# Patient Record
Sex: Female | Born: 1955 | Hispanic: Yes | Marital: Married | Smoking: Never smoker
Health system: Southern US, Community
[De-identification: ages and names within clinical notes are randomized; demographics above are authoritative.]

## PROBLEM LIST (undated history)

## (undated) DIAGNOSIS — I10 Essential (primary) hypertension: Secondary | ICD-10-CM

## (undated) DIAGNOSIS — R600 Localized edema: Secondary | ICD-10-CM

## (undated) DIAGNOSIS — R55 Syncope and collapse: Secondary | ICD-10-CM

## (undated) DIAGNOSIS — I493 Ventricular premature depolarization: Secondary | ICD-10-CM

## (undated) DIAGNOSIS — E785 Hyperlipidemia, unspecified: Secondary | ICD-10-CM

## (undated) DIAGNOSIS — I350 Nonrheumatic aortic (valve) stenosis: Secondary | ICD-10-CM

## (undated) DIAGNOSIS — I2510 Atherosclerotic heart disease of native coronary artery without angina pectoris: Secondary | ICD-10-CM

## (undated) DIAGNOSIS — I491 Atrial premature depolarization: Secondary | ICD-10-CM

## (undated) HISTORY — DX: Syncope and collapse: R55

## (undated) HISTORY — DX: Nonrheumatic aortic (valve) stenosis: I35.0

## (undated) HISTORY — DX: Localized edema: R60.0

## (undated) HISTORY — PX: TUBAL LIGATION: SHX77

## (undated) HISTORY — DX: Essential (primary) hypertension: I10

## (undated) HISTORY — PX: ANKLE SURGERY: SHX546

## (undated) HISTORY — DX: Hyperlipidemia, unspecified: E78.5

## (undated) HISTORY — PX: EYE SURGERY: SHX253

## (undated) HISTORY — PX: CARDIAC CATHETERIZATION: SHX172

## (undated) HISTORY — DX: Ventricular premature depolarization: I49.3

## (undated) HISTORY — PX: OTHER SURGICAL HISTORY: SHX169

## (undated) HISTORY — DX: Atrial premature depolarization: I49.1

---

## 2008-10-06 HISTORY — PX: UTERINE FIBROID SURGERY: SHX826

## 2013-07-06 ENCOUNTER — Encounter: Payer: Self-pay | Admitting: Cardiovascular Disease

## 2013-07-06 ENCOUNTER — Ambulatory Visit (INDEPENDENT_AMBULATORY_CARE_PROVIDER_SITE_OTHER): Payer: BC Managed Care – PPO | Admitting: Cardiovascular Disease

## 2013-07-06 VITALS — BP 150/70 | HR 71 | Ht 63.0 in | Wt 153.0 lb

## 2013-07-06 DIAGNOSIS — R079 Chest pain, unspecified: Secondary | ICD-10-CM | POA: Insufficient documentation

## 2013-07-06 DIAGNOSIS — R011 Cardiac murmur, unspecified: Secondary | ICD-10-CM

## 2013-07-06 NOTE — Progress Notes (Signed)
   History of Present Illness: 57 yo female with history of HTN, ? Valve problems, LE edema here today as a new patient. She is with an interpretor. She does not speak [place]. She is a poor historian. She describes feeling her heart beat heavily. There is a pain in the center of her chest that radiates to the back. No SOB. She was found to have a murmur by examination in primary care. No prior heart disease. Has never smoked.   Primary Care Physician: Lerry Liner, MD  Past Medical History  Diagnosis Date  . Hypertension   . Edema leg   . Heart murmur     Past Surgical History  Procedure Laterality Date  . Breast cyst removal      Current Outpatient Prescriptions  Medication Sig Dispense Refill  . triamterene-hydrochlorothiazide (DYAZIDE) 37.5-25 MG per capsule Take 1 capsule by mouth 4 (four) times daily - after meals and at bedtime.       No current facility-administered medications for this visit.    No Known Allergies  History   Social History  . Marital Status: Married    Spouse Name: N/A    Number of Children: 4  . Years of Education: N/A   Occupational History  . Construction    Social History Main Topics  . Smoking status: Never Smoker   . Smokeless tobacco: Never Used  . Alcohol Use: No  . Drug Use: No  . Sexual Activity: Not on file   Other Topics Concern  . Not on file   Social History Narrative  . No narrative on file    Family History  Problem Relation Age of Onset  . CAD Neg Hx     Review of Systems:  As stated in the HPI and otherwise negative.   BP 150/70  Pulse 71  Ht 5\' 3"  (1.6 m)  Wt 153 lb (69.4 kg)  BMI 27.11 kg/m2  Physical Examination: General: Well developed, well nourished, NAD HEENT: OP clear, mucus membranes moist SKIN: warm, dry. No rashes. Neuro: No focal deficits Musculoskeletal: Muscle strength 5/5 all ext Psychiatric: Mood and affect normal Neck: No JVD, no carotid bruits, no thyromegaly, no  lymphadenopathy. Lungs:Clear bilaterally, no wheezes, rhonci, crackles Cardiovascular: Regular rate and rhythm. Loud systolic murmurs. No gallops or rubs. Abdomen:Soft. Bowel sounds present. Non-tender.  Extremities: No lower extremity edema. Pulses are 2 + in the bilateral DP/PT.  EKG: NSR, rate 71 bpm. Possible old septal infarct. Diffuse ST depression.   Assessment and Plan:   1. Cardiac murmur: Loud systolic murmur. Likely aortic stenosis. Will arrange echo to assess.   2. Chest pain: Substernal chest pain. Worsened with exertion. Baseline EKG changes with diffuse ST depression. Will arrange Lexicscan stress myoview to exclude ischemia. (Lexiscan due to baseline EKG changes).

## 2013-07-06 NOTE — Patient Instructions (Addendum)
Your physician recommends that you schedule a follow-up appointment in:  About 6 weeks.   Your physician has requested that you have an echocardiogram. Echocardiography is a painless test that uses sound waves to create images of your heart. It provides your doctor with information about the size and shape of your heart and how well your heart's chambers and valves are working. This procedure takes approximately one hour. There are no restrictions for this procedure.   Your physician has requested that you have a lexiscan myoview. For further information please visit [email or web address]. Please follow instruction sheet, as given.    

## 2013-07-25 ENCOUNTER — Ambulatory Visit (HOSPITAL_BASED_OUTPATIENT_CLINIC_OR_DEPARTMENT_OTHER): Payer: BC Managed Care – PPO | Admitting: Radiology

## 2013-07-25 ENCOUNTER — Ambulatory Visit (HOSPITAL_COMMUNITY): Payer: BC Managed Care – PPO | Attending: Cardiology

## 2013-07-25 VITALS — BP 139/72 | HR 67 | Ht 63.0 in | Wt 151.0 lb

## 2013-07-25 DIAGNOSIS — I079 Rheumatic tricuspid valve disease, unspecified: Secondary | ICD-10-CM | POA: Insufficient documentation

## 2013-07-25 DIAGNOSIS — R011 Cardiac murmur, unspecified: Secondary | ICD-10-CM

## 2013-07-25 DIAGNOSIS — I10 Essential (primary) hypertension: Secondary | ICD-10-CM | POA: Insufficient documentation

## 2013-07-25 DIAGNOSIS — I080 Rheumatic disorders of both mitral and aortic valves: Secondary | ICD-10-CM | POA: Insufficient documentation

## 2013-07-25 DIAGNOSIS — I379 Nonrheumatic pulmonary valve disorder, unspecified: Secondary | ICD-10-CM | POA: Insufficient documentation

## 2013-07-25 DIAGNOSIS — R609 Edema, unspecified: Secondary | ICD-10-CM | POA: Insufficient documentation

## 2013-07-25 DIAGNOSIS — R079 Chest pain, unspecified: Secondary | ICD-10-CM | POA: Insufficient documentation

## 2013-07-25 DIAGNOSIS — R0989 Other specified symptoms and signs involving the circulatory and respiratory systems: Secondary | ICD-10-CM | POA: Insufficient documentation

## 2013-07-25 DIAGNOSIS — E785 Hyperlipidemia, unspecified: Secondary | ICD-10-CM | POA: Insufficient documentation

## 2013-07-25 DIAGNOSIS — R0609 Other forms of dyspnea: Secondary | ICD-10-CM | POA: Insufficient documentation

## 2013-07-25 DIAGNOSIS — R9431 Abnormal electrocardiogram [ECG] [EKG]: Secondary | ICD-10-CM | POA: Insufficient documentation

## 2013-07-25 MED ORDER — REGADENOSON 0.4 MG/5ML IV SOLN
0.4000 mg | Freq: Once | INTRAVENOUS | Status: AC
  Administered 2013-07-25: 0.4 mg via INTRAVENOUS

## 2013-07-25 MED ORDER — TECHNETIUM TC 99M SESTAMIBI GENERIC - CARDIOLITE
10.0000 | Freq: Once | INTRAVENOUS | Status: AC | PRN
  Administered 2013-07-25: 10 via INTRAVENOUS

## 2013-07-25 MED ORDER — TECHNETIUM TC 99M SESTAMIBI GENERIC - CARDIOLITE
30.0000 | Freq: Once | INTRAVENOUS | Status: AC | PRN
  Administered 2013-07-25: 30 via INTRAVENOUS

## 2013-07-25 NOTE — Progress Notes (Signed)
Echocardiogram performed.  

## 2013-07-25 NOTE — Progress Notes (Signed)
[facility] SITE 3 NUCLEAR MED [address] [place], [place] [address] [phone]    Cardiology Nuclear Med Study  Laurie Lambert is a 57 y.o. female     MRN : [id number]     DOB: [birth year]  Procedure Date: 07/25/2013  Nuclear Med Background Indication for Stress Test:  Evaluation for Ischemia and Abnormal EKG History:  No prior known history of CAD; 07-25-13 Echo: Severe AS Cardiac Risk Factors: Hypertension and Lipids  Symptoms:  Chest Pain (patient says she is currently having an 8/10 stabbing chest pain from front to back. When questioned a second time she stated she did NOT currently have any pain) and DOE   Nuclear Pre-Procedure Caffeine/Decaff Intake:  None> 12 hrs NPO After: 8:30pm   Lungs:  clear O2 Sat: 97% on room air. IV 0.9% NS with Angio Cath:  22g  IV Site: R Antecubital x 1, tolerated well IV Started by:  Irean Hong, RN  Chest Size (in):  34 Cup Size: C  Height: 5\' 3"  (1.6 m)  Weight:  151 lb (68.493 kg)  BMI:  Body mass index is 26.76 kg/(m^2). Tech Comments:  N/A    Nuclear Med Study 1 or 2 day study: 1 day  Stress Test Type:  Lexiscan  Reading MD: Tobias Alexander, MD  Order Authorizing Provider: Verne Carrow, MD  Resting Radionuclide: Technetium 66m Sestamibi  Resting Radionuclide Dose: 10.7 mCi   Stress Radionuclide:  Technetium 22m Sestamibi  Stress Radionuclide Dose: 33.0 mCi           Stress Protocol Rest HR: 67 Stress HR: 100  Rest BP: 139/72 Stress BP: 140/61  Exercise Time (min): n/a METS: n/a   Predicted Max HR: 165 bpm % Max HR: 60.61 bpm Rate Pressure Product: 62952   Dose of Adenosine (mg):  n/a Dose of Lexiscan: 0.4 mg  Dose of Atropine (mg): n/a Dose of Dobutamine: n/a mcg/kg/min (at max HR)  Stress Test Technologist: Nelson Chimes, BS-ES  Nuclear Technologist:  Domenic Polite, CNMT     Rest Procedure:  Myocardial perfusion imaging was performed at rest 45 minutes following the intravenous  administration of Technetium 22m Sestamibi. Rest ECG: NSR - Normal EKG  Stress Procedure:  The patient received IV Lexiscan 0.4 mg over 15-seconds.  Technetium 32m Sestamibi injected at 30-seconds.  Quantitative spect images were obtained after a 45 minute delay.Patient felt like her heart was racing with Lexiscan that resolved in recovery.  Stress ECG: No significant change from baseline ECG  QPS Raw Data Images:  Normal; no motion artifact; normal heart/lung ratio. Stress Images:  There is decreased perfusion in the mid and basal inferolateral and basal inferior and inferoseptal walls.  Rest Images:  There is a perfusion defect in the basal inferolateral wall.  Subtraction (SDS):  There is a medium size moderate severity reversible perfusion defect in the mid and basal inferolaterla,  basal inferior and inferoseptal walls.  Transient Ischemic Dilatation (Normal <1.22):  1.10 Lung/Heart Ratio (Normal <0.45):  0.31  Quantitative Gated Spect Images QGS EDV:  96 ml QGS ESV:  42 ml  Impression Exercise Capacity:  Lexiscan with no exercise. BP Response:  Normal blood pressure response. Clinical Symptoms:  No significant symptoms noted. ECG Impression:  No significant ST segment change suggestive of ischemia. Comparison with Prior Nuclear Study: No images to compare  Overall Impression:  Intermediate risk stress nuclear study with is a medium size moderate severity reversible perfusion defect in the RCA territory.  LV  Ejection Fraction: 56%.  LV Wall Motion:  NL LV Function; there is hypokinesis of the basal inferior and basal and inferolateral inferolateral walls.   Tobias Alexander, H 07/25/2013

## 2013-08-02 ENCOUNTER — Encounter: Payer: Self-pay | Admitting: Cardiovascular Disease

## 2013-08-02 ENCOUNTER — Encounter: Payer: Self-pay | Admitting: *Deleted

## 2013-08-02 ENCOUNTER — Ambulatory Visit (INDEPENDENT_AMBULATORY_CARE_PROVIDER_SITE_OTHER): Payer: BC Managed Care – PPO | Admitting: Cardiovascular Disease

## 2013-08-02 VITALS — BP 140/70 | HR 69 | Ht 63.0 in | Wt 148.0 lb

## 2013-08-02 DIAGNOSIS — I359 Nonrheumatic aortic valve disorder, unspecified: Secondary | ICD-10-CM

## 2013-08-02 DIAGNOSIS — I350 Nonrheumatic aortic (valve) stenosis: Secondary | ICD-10-CM | POA: Insufficient documentation

## 2013-08-02 DIAGNOSIS — R9439 Abnormal result of other cardiovascular function study: Secondary | ICD-10-CM

## 2013-08-02 DIAGNOSIS — R079 Chest pain, unspecified: Secondary | ICD-10-CM

## 2013-08-02 LAB — CBC WITH DIFFERENTIAL/PLATELET
Basophils Absolute: 0 10*3/uL (ref 0.0–0.1)
Basophils Relative: 0.4 % (ref 0.0–3.0)
Eosinophils Absolute: 0.1 10*3/uL (ref 0.0–0.7)
HCT: 38.1 % (ref 36.0–46.0)
Hemoglobin: 12.9 g/dL (ref 12.0–15.0)
Lymphocytes Relative: 40 % (ref 12.0–46.0)
Lymphs Abs: 2.4 10*3/uL (ref 0.7–4.0)
MCHC: 33.9 g/dL (ref 30.0–36.0)
MCV: 81.9 fl (ref 78.0–100.0)
Monocytes Absolute: 0.3 10*3/uL (ref 0.1–1.0)
Neutro Abs: 3.2 10*3/uL (ref 1.4–7.7)
RBC: 4.65 Mil/uL (ref 3.87–5.11)
RDW: 15.5 % — ABNORMAL HIGH (ref 11.5–14.6)

## 2013-08-02 LAB — BASIC METABOLIC PANEL
BUN: 6 mg/dL (ref 6–23)
Calcium: 9.3 mg/dL (ref 8.4–10.5)
Chloride: 100 mEq/L (ref 96–112)
Creatinine, Ser: 0.6 mg/dL (ref 0.4–1.2)
GFR: 119.01 mL/min (ref >60.00)
Potassium: 3.4 mEq/L — ABNORMAL LOW (ref 3.5–5.1)

## 2013-08-02 LAB — PROTIME-INR: Prothrombin Time: 12.4 s (ref 10.2–12.4)

## 2013-08-02 NOTE — Patient Instructions (Signed)
Your physician recommends that you schedule a follow-up appointment in:  4-6 weeks with Dr. Clifton James  Your physician has requested that you have a TEE. During a TEE, sound waves are used to create images of your heart. It provides your doctor with information about the size and shape of your heart and how well your heart's chambers and valves are working. In this test, a transducer is attached to the end of a flexible tube that's guided down your throat and into your esophagus (the tube leading from you mouth to your stomach) to get a more detailed image of your heart. You are not awake for the procedure. Please see the instruction sheet given to you today. For further information please visit [email or web address] Scheduled for August 12, 2013  Your physician has requested that you have a cardiac catheterization. Cardiac catheterization is used to diagnose and/or treat various heart conditions. Doctors may recommend this procedure for a number of different reasons. The most common reason is to evaluate chest pain. Chest pain can be a symptom of coronary artery disease (CAD), and cardiac catheterization can show whether plaque is narrowing or blocking your heart's arteries. This procedure is also used to evaluate the valves, as well as measure the blood flow and oxygen levels in different parts of your heart. For further information please visit [email or web address] Please follow instruction sheet, as given. Scheduled for August 12, 2013   Angiografa coronaria (Coronary Angiography) La angiografa es un procedimiento que se utiliza para observar los vasos sanguneos (arterias) que llevan la sangre hasta las diferentes partes del organismo. "Coronaria" se refiere a los vasos sanguneos del corazn. En este procedimiento se inyecta una sustancia de contraste a travs de un catter (un tubo hueco y [place], del tamao de un spaghetti cocido) en una arteria y [employer] se har un registro radiolgico. El registro  radiolgico mostrar si hay una obstruccin o un problema en un vaso sanguneo. ANTES DEL PROCEDIMIENTO  Converse con el profesional sobre si ha tenido Environmental consultant a las sustancias de contraste utilizadas en las radiografas si ha tenido dao o insuficiencia de los riones.  No coma ni beba desde la medianoche anterior hasta el momento del procedimiento, o segn se le indique.  Puede beber agua en cantidad suficiente para tomar la medicacin en la maana del procedimiento, en caso que se lo haya indicado el profesional que lo asiste.  Deber concurrir al hospital o a la sala de pacientes ambulatorios donde se Sales executive procedimiento 60 minutos antes del mismo o segn le indique el profesional que lo asiste. PROCEDIMIENTO  Podrn administrarle una medicacin que lo ayudar a Lexicographer antes y durante el procedimiento por va intravenosa en la mano o el brazo.  Podrn aplicarle un anestsico local que Data processing manager de insercin del catter.  Usted se preparar para el procedimiento lavando y rasurando el rea dnde se insertar el catter. Esto se realiza normalmente en la ingle pero tambin puede realizarse en el pliegue del brazo detrs del codo.  Un mdico especialista le insertar el catter con un alambre gua dentro de la arteria. Luego el catter es guiado con un tipo especial de rayos X (fluoroscopa) hacia el vaso sanguneo a examinar.  Se inyecta una sustancia especial y se efecta un registro radiolgico. ste mostrar si hay algn estrechamiento u obstruccin de la arteria. DESPUS DEL PROCEDIMIENTO  Despus del procedimiento deber permanecer en cama varias horas.  Se controlar el sitio de acceso y ser evaluado frecuentemente.  Se le  realizarn [employer], electrocardiogramas y radiografas.  Podr permanecer en el hospital toda la noche en observacin. SOLICITE ATENCIN MDICA DE INMEDIATO SI:  Presenta dolor en el pecho, dificultad respiratoria, se  siente dbil o se desmaya.  Observa sangrado, hinchazn o drenaje en el lugar de la insercin del catter.  Siente dolor, observa un cambio en la coloracin, enfriamiento o le apareciera un hematoma importante en la pierna o en el brazo donde el catter fue insertado.  Tiene fiebre. Document Released: 07/02/2005 Document Revised: 12/15/2011 [facility] Patient Information 2014 [place], [place].  Ecocardiograma transesofgico (Transesophageal Echocardiography)  Una ecocardiografa transesofgica (ETE) es un examen especial que produce imgenes del corazn a Glass blower/designer de ondas de sonido (ecocardiografa). Este tipo de ecocardiograma puede obtener mejores imgenes del corazn que un ecocardiograma tradicional. Se realiza mediante la colocacin de un tubo flexible a travs del esfago. El corazn est ubicado adelante del esfago. Debido a que Insurance underwriter y Training and development officer estn muy cerca uno de [place], [place] mdico podr tomar imgenes muy claras y Nurse, learning disability del corazn a Glass blower/designer de [place].  PORQU REALIZARSE UN ETE? El profesional podr necesitar ms informacin basndose en su enfermedad. Un ETE por lo general se realiza por:  El profesional podr necesitar ms informacin basndose en los resultados de un ecocardiograma normal. Si ha tenido ictus, se Civil engineer, contracting debido a la formacin de un cogulo en el corazn. Con un ETE se pueden visualizar distintas zonas del corazn y buscar cogulos. Controlar la anatoma y funcin de los vasos sanguneos. El profesional observar especialmente la vlvula mitral. Para observar si hay inflamacin de la cubierta interna del corazn (endocarditis). Para evaluar el septum del corazn y la presencia de una patente de foramen oval (FOP). Para ayudar a diagnosticar una diseccin artica. Durante una ciruga de vlvula cardaca, se realizar un ETE. Esto permite que el cirujano evale la reparacin de la vlvula antes de realizar el cierre del trax. INFORME AL  PROFESIONAL QUE LO ASISTE SOBRE LOS SIGUIENTES PUNTOS: Dificultad para tragar. Una obstruccin esofgica. Uso de aspirinas o tratamiento antiplaquetario. RIESGOS Y COMPLICACIONES A pesar de que es muy poco frecuente, una complicacin potencial del ETE es un desgarro o ruptura esofgica.  ANTES DEL PROCEDIMIENTO Deber presentarse una hora antes del procedimiento a menos que el profesional que lo asiste le indique otra cosa. No debe comer ni beber nada 6 horas antes de la [place], o segn le indique el mdico. Se le colocar una dispositivo intravenoso en el brazo. PROCEDIMIENTO Se administrar un sedante por va intravenosa para mantenerlo relajado durante el ETE. Se podr colocar un anestsico local en la parte posterior de la garganta para adormecerla. Durante el procedimiento, se controlarn los signos vitales como la presin, ritmo cardaco y respiracin. El tubo de ETE es largo y flexible. Tiene el ancho del tamao de un dedo ndice de un hombre [place]. La punta del tubo se coloca en la parte posterior de la boca y se le pedir que trague. Esto hace que la punta del tubo baje hacia el esfago. Una vez que est en la zona correcta, el mdico podr tomar imgenes del corazn. Este no es un procedimiento doloroso. Debe sentir una presin en la parte trasera de la garganta. El tubo no entra a la trquea, y no Psychologist, occupational. El tiempo que pasar en el hospital suele ser de menos de 2 horas. DESPUES DEL PROCEDIMIENTO Estar en cama, descansando, hasta que haya recuperado al conciencia por completo. Cuando lo despierten,  es posible que sienta un dolor leve en la garganta y probablemente an se sienta adormecido. Esto mejorar lentamente. No podr comer ni beber hasta que est claro que el adormecimiento ha mejorado. Una vez que haya podido beber, Geographical information systems officer, y sentarse en la cama sin sentir nuseas o mareos, podr vestirse y Hotel manager a Hotel manager. No conduzca. Le han dado medicamentos que  adormecen y esto puede reducir sus reflejos. Debe estar acompaado por un amigo o un miembro de la familia durante las prximas 24 horas luego del examen. Obtencin de los [place] de las pruebas  Es su responsabilidad retirar el resultado del [place]. Consulte en el laboratorio cuando y cmo podr [employer].  SOLICITE ATENCIN MDICA DE INMEDIATO SI: Siente dolor en el pecho. Presenta dificultades para respirar. Tose y escupe o vomita sangre. ASEGRESE QUE: Comprende estas instrucciones. Controlar su enfermedad. Solicitar ayuda de inmediato si no mejora o empeora. Document Released: 09/04/2008 Document Revised: 12/15/2011 [facility] Patient Information 2014 [place], [place].

## 2013-08-02 NOTE — Progress Notes (Signed)
History of Present Illness: 57 yo female with history of HTN, aortic stenosis,  LE edema here today for cardiac follow up. I saw her 07/06/13 as a new patient for evaluation of chest pain, LE edema and murmur. No prior diagnosis of valvular disease. I arranged an echo which showed severe AS with mean gradient of 54 mmHg and peak gradient of 107 mmHg. LV function is normal. There is mild MR. Stress myoview with possible inferior wall ischemia.   She is here today for follow up. She is with an interpretor. She does not speak [place]. She continues to have dyspnea, chest pain.   Primary Care Physician: Lerry Liner, MD  Past Medical History  Diagnosis Date  . Hypertension   . Edema leg   . Heart murmur     Past Surgical History  Procedure Laterality Date  . Breast cyst removal      Current Outpatient Prescriptions  Medication Sig Dispense Refill  . triamterene-hydrochlorothiazide (DYAZIDE) 37.5-25 MG per capsule Take 1 capsule by mouth 4 (four) times daily - after meals and at bedtime.       No current facility-administered medications for this visit.    No Known Allergies  History   Social History  . Marital Status: Married    Spouse Name: N/A    Number of Children: 4  . Years of Education: N/A   Occupational History  . Construction    Social History Main Topics  . Smoking status: Never Smoker   . Smokeless tobacco: Never Used  . Alcohol Use: No  . Drug Use: No  . Sexual Activity: Not on file   Other Topics Concern  . Not on file   Social History Narrative  . No narrative on file    Family History  Problem Relation Age of Onset  . CAD Neg Hx     Review of Systems:  As stated in the HPI and otherwise negative.   BP 140/70  Pulse 69  Ht 5\' 3"  (1.6 m)  Wt 148 lb (67.132 kg)  BMI 26.22 kg/m2  SpO2 98%  Physical Examination: General: Well developed, well nourished, NAD HEENT: OP clear, mucus membranes moist SKIN: warm, dry. No rashes. Neuro: No  focal deficits Musculoskeletal: Muscle strength 5/5 all ext Psychiatric: Mood and affect normal Neck: No JVD, no carotid bruits, no thyromegaly, no lymphadenopathy. Lungs:Clear bilaterally, no wheezes, rhonci, crackles Cardiovascular: Regular rate and rhythm. Loud systolic murmur. No gallops or rubs. Abdomen:Soft. Bowel sounds present. Non-tender.  Extremities: No lower extremity edema. Pulses are 2 + in the bilateral DP/PT.  Echo 07/25/13:  - Left ventricle: The cavity size was normal. Wall thickness was increased in a pattern of mild LVH. Systolic function was normal. The estimated ejection fraction was in the range of 55% to 60%. - Aortic valve: There was severe stenosis. Mild regurgitation. - Mitral valve: Mild regurgitation. - Left atrium: The atrium was mildly dilated. - Atrial septum: No defect or patent foramen ovale was identified. - Pulmonary arteries: PA peak pressure: 37mm Hg (S).  Stress myoview 07/26/13: Stress Procedure: The patient received IV Lexiscan 0.4 mg over 15-seconds. Technetium 57m Sestamibi injected at 30-seconds. Quantitative spect images were obtained after a 45 minute delay.Patient felt like her heart was racing with Lexiscan that resolved in recovery.  Stress ECG: No significant change from baseline ECG  QPS  Raw Data Images: Normal; no motion artifact; normal heart/lung ratio.  Stress Images: There is decreased perfusion in the mid and basal  inferolateral and basal inferior and inferoseptal walls.  Rest Images: There is a perfusion defect in the basal inferolateral wall.  Subtraction (SDS): There is a medium size moderate severity reversible perfusion defect in the mid and basal inferolaterla, basal inferior and inferoseptal walls.  Transient Ischemic Dilatation (Normal <1.22): 1.10  Lung/Heart Ratio (Normal <0.45): 0.31  Quantitative Gated Spect Images  QGS EDV: 96 ml  QGS ESV: 42 ml  Impression  Exercise Capacity: Lexiscan with no exercise.  BP  Response: Normal blood pressure response.  Clinical Symptoms: No significant symptoms noted.  ECG Impression: No significant ST segment change suggestive of ischemia.  Comparison with Prior Nuclear Study: No images to compare  Overall Impression: Intermediate risk stress nuclear study with is a medium size moderate severity reversible perfusion defect in the RCA territory.  LV Ejection Fraction: 56%. LV Wall Motion: NL LV Function; there is hypokinesis of the basal inferior and basal and inferolateral inferolateral walls.   Assessment and Plan:   1. Aortic valve stenosis: Severe aortic stenosis by recent echo. She will need a TEE to further evaluate as well as a right and left heart cath. Will arrange on the same day for patient convenience especially given difficulty with communication through an interperetor. Plan TEE am of 08/12/13 with right and left heart cath later that day after TEE.   2. Chest pain: Substernal chest pain worsened with exertion. Baseline EKG changes with diffuse ST depression. Stress test with possible inferior wall ischemia. Will arrange cardiac cath to exclude obstructive CAD. This will be done on August 12, 2013 at [facility]. Risks and benefits reviewed with pt. Pre-cath labs today.

## 2013-08-03 MED ORDER — POTASSIUM CHLORIDE CRYS ER 20 MEQ PO TBCR: EXTENDED_RELEASE_TABLET | ORAL | Status: DC

## 2013-08-03 NOTE — Addendum Note (Signed)
Addended by: Harriet Butte on: 08/03/2013 03:05 PM   Modules accepted: Orders

## 2013-08-12 ENCOUNTER — Ambulatory Visit (HOSPITAL_COMMUNITY)
Admission: RE | Admit: 2013-08-12 | Discharge: 2013-08-12 | Disposition: A | Discharge status: Home / Self Care | Payer: BC Managed Care – PPO | Source: Ambulatory Visit | Attending: Cardiology | Admitting: Cardiology

## 2013-08-12 ENCOUNTER — Other Ambulatory Visit: Payer: Self-pay | Admitting: *Deleted

## 2013-08-12 ENCOUNTER — Encounter (HOSPITAL_COMMUNITY)
Admission: RE | Disposition: A | Discharge status: Home / Self Care | Payer: Self-pay | Source: Ambulatory Visit | Attending: Cardiology

## 2013-08-12 ENCOUNTER — Encounter (HOSPITAL_COMMUNITY): Payer: Self-pay

## 2013-08-12 DIAGNOSIS — R072 Precordial pain: Secondary | ICD-10-CM | POA: Insufficient documentation

## 2013-08-12 DIAGNOSIS — I2510 Atherosclerotic heart disease of native coronary artery without angina pectoris: Secondary | ICD-10-CM

## 2013-08-12 DIAGNOSIS — I359 Nonrheumatic aortic valve disorder, unspecified: Secondary | ICD-10-CM

## 2013-08-12 DIAGNOSIS — I350 Nonrheumatic aortic (valve) stenosis: Secondary | ICD-10-CM

## 2013-08-12 DIAGNOSIS — I10 Essential (primary) hypertension: Secondary | ICD-10-CM | POA: Insufficient documentation

## 2013-08-12 HISTORY — PX: LEFT AND RIGHT HEART CATHETERIZATION WITH CORONARY ANGIOGRAM: SHX5449

## 2013-08-12 HISTORY — PX: TEE WITHOUT CARDIOVERSION: SHX5443

## 2013-08-12 LAB — POCT I-STAT 3, ART BLOOD GAS (G3+)
Acid-base deficit: 3 mmol/L — ABNORMAL HIGH (ref 0.0–2.0)
Bicarbonate: 22.4 mEq/L (ref 20.0–24.0)
O2 Saturation: 94 %
TCO2: 24 mmol/L (ref 0–100)
pCO2 arterial: 39.6 mmHg (ref 35.0–45.0)
pH, Arterial: 7.361 (ref 7.350–7.450)
pO2, Arterial: 73 mmHg — ABNORMAL LOW (ref 80.0–100.0)

## 2013-08-12 LAB — POCT I-STAT 3, VENOUS BLOOD GAS (G3P V)
Acid-base deficit: 1 mmol/L (ref 0.0–2.0)
Bicarbonate: 24.4 mEq/L — ABNORMAL HIGH (ref 20.0–24.0)
O2 Saturation: 68 %
TCO2: 26 mmol/L (ref 0–100)
pCO2, Ven: 44.3 mmHg — ABNORMAL LOW (ref 45.0–50.0)
pH, Ven: 7.349 — ABNORMAL HIGH (ref 7.250–7.300)
pO2, Ven: 38 mmHg (ref 30.0–45.0)

## 2013-08-12 SURGERY — LEFT AND RIGHT HEART CATHETERIZATION WITH CORONARY ANGIOGRAM
Anesthesia: LOCAL

## 2013-08-12 SURGERY — ECHOCARDIOGRAM, TRANSESOPHAGEAL
Anesthesia: Moderate Sedation

## 2013-08-12 MED ORDER — LIDOCAINE HCL (PF) 1 % IJ SOLN
INTRAMUSCULAR | Status: AC
  Filled 2013-08-12: qty 30

## 2013-08-12 MED ORDER — HEPARIN (PORCINE) IN NACL 2-0.9 UNIT/ML-% IJ SOLN
INTRAMUSCULAR | Status: AC
  Filled 2013-08-12: qty 1000

## 2013-08-12 MED ORDER — NITROGLYCERIN 0.2 MG/ML ON CALL CATH LAB
INTRAVENOUS | Status: AC
  Filled 2013-08-12: qty 1

## 2013-08-12 MED ORDER — MIDAZOLAM HCL 5 MG/ML IJ SOLN
INTRAMUSCULAR | Status: AC
  Filled 2013-08-12: qty 2

## 2013-08-12 MED ORDER — ACETAMINOPHEN 325 MG PO TABS: 650.0000 mg | ORAL_TABLET | ORAL | Status: DC | PRN

## 2013-08-12 MED ORDER — ONDANSETRON HCL 4 MG/2ML IJ SOLN: 4.0000 mg | Freq: Four times a day (QID) | INTRAMUSCULAR | Status: DC | PRN

## 2013-08-12 MED ORDER — SODIUM CHLORIDE 0.9 % IV SOLN
INTRAVENOUS | Status: DC
  Administered 2013-08-12: 09:00:00 via INTRAVENOUS

## 2013-08-12 MED ORDER — SODIUM CHLORIDE 0.9 % IV SOLN: INTRAVENOUS | Status: DC

## 2013-08-12 MED ORDER — MIDAZOLAM HCL 2 MG/2ML IJ SOLN
INTRAMUSCULAR | Status: AC
  Filled 2013-08-12: qty 2

## 2013-08-12 MED ORDER — FENTANYL CITRATE 0.05 MG/ML IJ SOLN
INTRAMUSCULAR | Status: AC
  Filled 2013-08-12: qty 2

## 2013-08-12 MED ORDER — MIDAZOLAM HCL 10 MG/2ML IJ SOLN
INTRAMUSCULAR | Status: DC | PRN
  Administered 2013-08-12 (×2): 2 mg via INTRAVENOUS

## 2013-08-12 MED ORDER — FENTANYL CITRATE 0.05 MG/ML IJ SOLN
INTRAMUSCULAR | Status: DC | PRN
  Administered 2013-08-12 (×2): 25 ug via INTRAVENOUS

## 2013-08-12 NOTE — Progress Notes (Signed)
Discharge instruction given by  Wyvonnia Dusky -interpeter.  Pt and CG stated they understood the instructions.  Pt to car via wheelchair.

## 2013-08-12 NOTE — CV Procedure (Signed)
     Transesophageal Echocardiogram Note  Laurie Lambert [id number] Nov 22, 1955  Procedure: Transesophageal Echocardiogram Indications: Aortic stenosis  Procedure Details Consent: Obtained Time Out: Verified patient identification, verified procedure, site/side was marked, verified correct patient position, special equipment/implants available, Radiology Safety Procedures followed,  medications/allergies/relevent history reviewed, required imaging and test results available.  Performed  Medications: Fentanyl: 50 mcg iv Versed: 4 mg iv  Left Ventrical:  Good LV function, no WMA  Mitral Valve: Mild MR  Aortic Valve: Trileaflet, Severely sclerotic and calcified, with significantly restricted leaflet motion, coronary leaflets have very limited motion, non-coronary leaflet is fixed, severely calcified. There is severe aortic stenosis, gradients are most probably underestimated as the acquisition angle was suboptimal, but are still severely elevated, peak gradient is 61 mmHg, mean 38 mmHg. AVA by continuity equation is 0.7 mm. AVA by planimetry 0.7 cm2.  There is moderate aortic insufficiency.  There is no dilatation of the ascending aorta. Normal origin of the coronary arteries.  Tricuspid Valve: Trace TR.  Pulmonic Valve: Normal, no PR  Left Atrium/ Left atrial appendage: Mildly dilated LA, no thrombus in LA or LAA  Atrial septum: Normal.  Aorta: Minimal AS plague   Complications: No apparent complications Patient did tolerate procedure well.  Lars Masson, MD, [facility] 08/12/2013, 10:33 AM

## 2013-08-12 NOTE — CV Procedure (Signed)
Cardiac Catheterization Operative Report  Laurie Lambert [id number] 11/7/201411:56 AM Pcp Not In System  Procedure Performed:  1. Left Heart Catheterization 2. Selective Coronary Angiography 3. Right Heart Catheterization 4. Left ventricular pressures  Operator: Verne Carrow, MD  Indication:  57 yo female with history of HTN, severe aortic stenosis, LE edema, chest pain and abnormal stress myoview  here today for cardiac cath to define coronary anatomy and assess valve/pressures before surgical referral for severe AS. TEE this am.                             Procedure Details: The risks, benefits, complications, treatment options, and expected outcomes were discussed with the patient. The patient and/or family concurred with the proposed plan, giving informed consent. The patient was brought to the cath lab after IV hydration was begun and oral premedication was given. The patient was further sedated with Versed and Fentanyl. The right gorin was prepped and draped in the usual manner. Using the modified Seldinger access technique, a 5 French sheath was placed in the right femoral artery. A 6 French sheath was inserted into the right femoral vein. A multi-purpose catheter was used to perform a right heart catheterization. Standard diagnostic catheters were used to perform selective coronary angiography. An AL-1 catheter was used with a soft straight wire to cross the aortic valve. There were no immediate complications. The patient was taken to the recovery area in stable condition.   Hemodynamic Findings: Ao:  13859            LV: 197/9/22 RA:  9              RV: 36/7/11 PA: 34/11 (mean 22) PCWP: 17 Fick Cardiac Output: 4.96 L/min Fick Cardiac Index: 2.92 L/min/m2 Central Aortic Saturation: 94% Pulmonary Artery Saturation: 68%  Aortic valve data: Peak to peak gradient 56 mm Hg                                 Mean gradient 46 mm Hg                                 AVA  0.89 cm2                                 AVA index 0.52  Angiographic Findings:  Left main: No obstructive disease.   Left Anterior Descending Artery: Large caliber vessel that courses to the apex. 50% mid stenosis. 2 small caliber diagonal branches.   Circumflex Artery: Large caliber vessel with termination into a large obtuse marginal branch. Mild plaque disease in the obtuse marginal branch.   Right Coronary Artery: Large dominant vessel with long 99% stenosis of the mid vessel followed by 99% stenosis in the distal vessel. The posterolateral artery and the PDA is small to moderate in caliber with mild diffuse plaque disease. The distal vessel is seen to fill also from left to right collaterals.   Left Ventricular Angiogram: Deferred.   Impression: 1. Double vessel CAD  2. Severe aortic valve stenosis.  3. Recent chest pain, dyspnea, lower extremity edema.   Recommendations: TEE this am confirming severely calcified aortic valve. She is symptomatic from her valvular disease and her CAD. Will refer  to CT surgery for evaluation for AVR and bypass. Pt is here with her husband and an interpretor and expresses an understanding of the plan.        Complications:  None; patient tolerated the procedure well.

## 2013-08-12 NOTE — H&P (View-Only) (Signed)
  History of Present Illness: 57 yo female with history of HTN, aortic stenosis,  LE edema here today for cardiac follow up. I saw her 07/06/13 as a new patient for evaluation of chest pain, LE edema and murmur. No prior diagnosis of valvular disease. I arranged an echo which showed severe AS with mean gradient of 54 mmHg and peak gradient of 107 mmHg. LV function is normal. There is mild MR. Stress myoview with possible inferior wall ischemia.   She is here today for follow up. She is with an interpretor. She does not speak English. She continues to have dyspnea, chest pain.   Primary Care Physician: Dwight Williams, MD  Past Medical History  Diagnosis Date  . Hypertension   . Edema leg   . Heart murmur     Past Surgical History  Procedure Laterality Date  . Breast cyst removal      Current Outpatient Prescriptions  Medication Sig Dispense Refill  . triamterene-hydrochlorothiazide (DYAZIDE) 37.5-25 MG per capsule Take 1 capsule by mouth 4 (four) times daily - after meals and at bedtime.       No current facility-administered medications for this visit.    No Known Allergies  History   Social History  . Marital Status: Married    Spouse Name: N/A    Number of Children: 4  . Years of Education: N/A   Occupational History  . Construction    Social History Main Topics  . Smoking status: Never Smoker   . Smokeless tobacco: Never Used  . Alcohol Use: No  . Drug Use: No  . Sexual Activity: Not on file   Other Topics Concern  . Not on file   Social History Narrative  . No narrative on file    Family History  Problem Relation Age of Onset  . CAD Neg Hx     Review of Systems:  As stated in the HPI and otherwise negative.   BP 140/70  Pulse 69  Ht 5' 3" (1.6 m)  Wt 148 lb (67.132 kg)  BMI 26.22 kg/m2  SpO2 98%  Physical Examination: General: Well developed, well nourished, NAD HEENT: OP clear, mucus membranes moist SKIN: warm, dry. No rashes. Neuro: No  focal deficits Musculoskeletal: Muscle strength 5/5 all ext Psychiatric: Mood and affect normal Neck: No JVD, no carotid bruits, no thyromegaly, no lymphadenopathy. Lungs:Clear bilaterally, no wheezes, rhonci, crackles Cardiovascular: Regular rate and rhythm. Loud systolic murmur. No gallops or rubs. Abdomen:Soft. Bowel sounds present. Non-tender.  Extremities: No lower extremity edema. Pulses are 2 + in the bilateral DP/PT.  Echo 07/25/13:  - Left ventricle: The cavity size was normal. Wall thickness was increased in a pattern of mild LVH. Systolic function was normal. The estimated ejection fraction was in the range of 55% to 60%. - Aortic valve: There was severe stenosis. Mild regurgitation. - Mitral valve: Mild regurgitation. - Left atrium: The atrium was mildly dilated. - Atrial septum: No defect or patent foramen ovale was identified. - Pulmonary arteries: PA peak pressure: 37mm Hg (S).  Stress myoview 07/26/13: Stress Procedure: The patient received IV Lexiscan 0.4 mg over 15-seconds. Technetium 99m Sestamibi injected at 30-seconds. Quantitative spect images were obtained after a 45 minute delay.Patient felt like her heart was racing with Lexiscan that resolved in recovery.  Stress ECG: No significant change from baseline ECG  QPS  Raw Data Images: Normal; no motion artifact; normal heart/lung ratio.  Stress Images: There is decreased perfusion in the mid and basal   inferolateral and basal inferior and inferoseptal walls.  Rest Images: There is a perfusion defect in the basal inferolateral wall.  Subtraction (SDS): There is a medium size moderate severity reversible perfusion defect in the mid and basal inferolaterla, basal inferior and inferoseptal walls.  Transient Ischemic Dilatation (Normal <1.22): 1.10  Lung/Heart Ratio (Normal <0.45): 0.31  Quantitative Gated Spect Images  QGS EDV: 96 ml  QGS ESV: 42 ml  Impression  Exercise Capacity: Lexiscan with no exercise.  BP  Response: Normal blood pressure response.  Clinical Symptoms: No significant symptoms noted.  ECG Impression: No significant ST segment change suggestive of ischemia.  Comparison with Prior Nuclear Study: No images to compare  Overall Impression: Intermediate risk stress nuclear study with is a medium size moderate severity reversible perfusion defect in the RCA territory.  LV Ejection Fraction: 56%. LV Wall Motion: NL LV Function; there is hypokinesis of the basal inferior and basal and inferolateral inferolateral walls.   Assessment and Plan:   1. Aortic valve stenosis: Severe aortic stenosis by recent echo. She will need a TEE to further evaluate as well as a right and left heart cath. Will arrange on the same day for patient convenience especially given difficulty with communication through an interperetor. Plan TEE am of 08/12/13 with right and left heart cath later that day after TEE.   2. Chest pain: Substernal chest pain worsened with exertion. Baseline EKG changes with diffuse ST depression. Stress test with possible inferior wall ischemia. Will arrange cardiac cath to exclude obstructive CAD. This will be done on August 12, 2013 at [facility]. Risks and benefits reviewed with pt. Pre-cath labs today.  

## 2013-08-12 NOTE — Progress Notes (Signed)
  Echocardiogram Echocardiogram Transesophageal has been performed.  Georgian Co 08/12/2013, 10:24 AM

## 2013-08-12 NOTE — Progress Notes (Signed)
Interpreter Wyvonnia Dusky for Dr Zola Button

## 2013-08-12 NOTE — OR Nursing (Signed)
Pt moved to cath lab holding for cardiac cath. Report given,

## 2013-08-12 NOTE — Progress Notes (Signed)
Interpreter Wyvonnia Dusky for RN Mil[facility] Stay

## 2013-08-12 NOTE — Interval H&P Note (Signed)
History and Physical Interval Note:  08/12/2013 11:05 AM  Laurie Lambert  has presented today for cardiac cath with severe AS, abnormal myoview.  The various methods of treatment have been discussed with the patient and family. After consideration of risks, benefits and other options for treatment, the patient has consented to  Procedure(s): LEFT AND RIGHT HEART CATHETERIZATION WITH CORONARY ANGIOGRAM (N/A) as a surgical intervention .  The patient's history has been reviewed, patient examined, no change in status, stable for surgery.  I have reviewed the patient's chart and labs.  Questions were answered to the patient's satisfaction.   Cath Lab Visit (complete for each Cath Lab visit)  Clinical Evaluation Leading to the Procedure:   ACS: no  Non-ACS:    Anginal Classification: CCS II  Anti-ischemic medical therapy: No Therapy  Non-Invasive Test Results: Intermediate-risk stress test findings: cardiac mortality 1-3%/year  Prior CABG: No previous CABG          MCALHANY,CHRISTOPHER

## 2013-08-12 NOTE — Interval H&P Note (Signed)
History and Physical Interval Note:  08/12/2013 9:09 AM  Laurie Lambert  has presented today for surgery, with the diagnosis of aortic stenosis   The various methods of treatment have been discussed with the patient and family. After consideration of risks, benefits and other options for treatment, the patient has consented to  Procedure(s) with comments: TRANSESOPHAGEAL ECHOCARDIOGRAM (TEE) (N/A) - Spanish Interpreter needed as a surgical intervention .  The patient's history has been reviewed, patient examined, no change in status, stable for surgery.  I have reviewed the patient's chart and labs.  Questions were answered to the patient's satisfaction.     Tobias Alexander, H

## 2013-08-12 NOTE — Progress Notes (Signed)
Pt received from cath procedure alert and tolerating food and fluids.  Family at bedside.

## 2013-08-15 ENCOUNTER — Telehealth: Payer: Self-pay | Admitting: Cardiovascular Disease

## 2013-08-15 ENCOUNTER — Encounter (HOSPITAL_COMMUNITY): Payer: Self-pay | Admitting: Cardiology

## 2013-08-15 NOTE — Telephone Encounter (Signed)
New problem   fyi pt has an appt with Dr Laneta Simmers on 08/18/13 @ 3pm pt need to arrive early and need you to call the pt to confirm.

## 2013-08-15 NOTE — Telephone Encounter (Signed)
Left pt a message to call back. 

## 2013-08-15 NOTE — Telephone Encounter (Signed)
Left message to call back  

## 2013-08-15 NOTE — Telephone Encounter (Signed)
Left pt a detail message to call back. 

## 2013-08-16 NOTE — Telephone Encounter (Signed)
Pt called Dr. Sharee Pimple office this AM, and rescheduled the appointment for November 25 th.

## 2013-08-16 NOTE — Telephone Encounter (Signed)
Left pt a detail message of appointment with Dr. Laneta Simmers on 08/18/13 at 3:00 PM and that is urgent for her to call that Dr's office to confirm appointment. Office phone # given.

## 2013-08-17 ENCOUNTER — Ambulatory Visit: Payer: BC Managed Care – PPO | Admitting: Cardiovascular Disease

## 2013-08-18 ENCOUNTER — Encounter: Payer: BC Managed Care – PPO | Admitting: Surgery

## 2013-08-18 ENCOUNTER — Telehealth: Payer: Self-pay | Admitting: *Deleted

## 2013-08-18 NOTE — Telephone Encounter (Signed)
Laurie Lambert called to verify the date and time of appointment with DR. Bartle, because she was not sure  a family member made the appointment for her. I called Dr. Sharee Pimple office to find out. Patient is aware of appointment date November 26 th at 2:00 PM pt to arrived at 1:45 PM .

## 2013-08-31 ENCOUNTER — Encounter: Payer: Self-pay | Admitting: Surgery

## 2013-08-31 ENCOUNTER — Institutional Professional Consult (permissible substitution) (INDEPENDENT_AMBULATORY_CARE_PROVIDER_SITE_OTHER): Payer: BC Managed Care – PPO | Admitting: Surgery

## 2013-08-31 VITALS — BP 157/69 | HR 62 | Resp 20 | Ht 62.0 in | Wt 148.0 lb

## 2013-08-31 DIAGNOSIS — I2510 Atherosclerotic heart disease of native coronary artery without angina pectoris: Secondary | ICD-10-CM

## 2013-08-31 DIAGNOSIS — I359 Nonrheumatic aortic valve disorder, unspecified: Secondary | ICD-10-CM

## 2013-08-31 DIAGNOSIS — I350 Nonrheumatic aortic (valve) stenosis: Secondary | ICD-10-CM

## 2013-09-01 ENCOUNTER — Encounter: Payer: Self-pay | Admitting: Surgery

## 2013-09-01 NOTE — Progress Notes (Signed)
PCP is Lerry Liner, MD Referring Provider is Kathleene Hazel*  Chief Complaint  Patient presents with  . Coronary Artery Disease    Surgical eval for possible CABGs and AVR, Cardiac Cath and TEE on 08/12/13  . Aortic Stenosis    HPI:  The patient is a 57 year old [place] woman who speaks no [place] and is here with an interpreter today. She was initially seen by Dr. Clifton James on 07/06/2013 for complaints of some chest pain radiating to the back and some palpitations with a feeling of her heart beating in her left ear. She was noted to have a heart murmur and leg edema by her primary physician. An echo showed severe AS with a mean gradient of 54 mm Hg and a peak gradient of 107 mm Hg with normal LV function and mild MR. A stress myoview showed possible inferior ischemia. A TEE confirmed severe AS with a trileaflet valve and an AVA of 0.7 cm2 with moderate AI. Cardiac cath showed severe AS with a mean gradient of 46 and a peak to peak gradient of 56. Coronary angiography showed the LAD to have a 50% mid vessel stenosis. The RCA is a large dominant vessel with a long 99% stenosis in the mid vessel followed by 99% stenosis in the distal vessel.  Past Medical History  Diagnosis Date  . Hypertension   . Edema leg   . Heart murmur   . Hyperlipemia     Past Surgical History  Procedure Laterality Date  . Breast cyst removal    . Ankle surgery Right     25 years ago  . Tee without cardioversion N/A 08/12/2013    Procedure: TRANSESOPHAGEAL ECHOCARDIOGRAM (TEE);  Surgeon: Lars Masson, MD;  Location: [facility] ENDOSCOPY;  Service: Cardiovascular;  Laterality: N/A;  Spanish Interpreter needed    Family History  Problem Relation Age of Onset  . CAD Neg Hx     Social History History  Substance Use Topics  . Smoking status: Never Smoker   . Smokeless tobacco: Never Used  . Alcohol Use: No  Lives with her husband in [place]. 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 Travels  to other cities during the week to work and comes home on the weekend.  Current Outpatient Prescriptions  Medication Sig Dispense Refill  . lovastatin (MEVACOR) 20 MG tablet Take 20 mg by mouth at bedtime.      . triamterene-hydrochlorothiazide (DYAZIDE) 37.5-25 MG per capsule Take 1 capsule by mouth daily.        No current facility-administered medications for this visit.    No Known Allergies  Review of Systems  Constitutional: Negative for fever, chills, activity change, fatigue and unexpected weight change.  HENT: Negative.   Eyes: Negative.   Respiratory: Positive for shortness of breath.   Cardiovascular: Positive for chest pain, palpitations and leg swelling.       No orthopnea or PND  Gastrointestinal: Negative.   Endocrine: Negative.   Genitourinary: Negative.   Musculoskeletal: Negative.   Skin: Negative.   Allergic/Immunologic: Negative.   Neurological: Negative.  Negative for dizziness, syncope and light-headedness.  Hematological: Negative.   Psychiatric/Behavioral: Negative.     BP 157/69  Pulse 62  Resp 20  Ht 5\' 2"  (1.575 m)  Wt 148 lb (67.132 kg)  BMI 27.06 kg/m2  SpO2 98% Physical Exam  Constitutional: She is oriented to person, place, and time. She appears well-developed and well-nourished. No distress.  HENT:  Head: Atraumatic.  Mouth/Throat: Oropharynx is clear and moist.  Eyes: EOM are normal. Pupils are equal, round, and reactive to light.  Neck: Normal range of motion. Neck supple. No JVD present. No thyromegaly present.  Transmitted murmur to both sides of the neck.  Cardiovascular: Normal rate, regular rhythm and intact distal pulses.   Murmur heard. Harsh 3/6 crescendo/decrescendo murmur loudest along RSB.  Pulmonary/Chest: Effort normal and breath sounds normal. No respiratory distress. She has no wheezes. She has no rales.  Abdominal: Soft. Bowel sounds are normal. She exhibits no distension and no mass. There is no tenderness.    Musculoskeletal: She exhibits no edema.  Lymphadenopathy:    She has no cervical adenopathy.  Neurological: She is alert and oriented to person, place, and time. She has normal strength. No cranial nerve deficit or sensory deficit.  Skin: Skin is warm and dry.  Psychiatric: She has a normal mood and affect.     Diagnostic Tests:  [facility] [address] [place], [place] [address] [phone]  ------------------------------------------------------------ Transthoracic Echocardiography  Patient: Laurie Lambert, Laurie Lambert MR #: [id number] Study Date: 07/25/2013 Gender: F Age: 16 Height: 160cm Weight: 69.4kg BSA: 1.2m^2 Pt. Status: Room:  PERFORMING [facility] SONOGRAPHER Philomena Course, RDCS ORDERING Clifton James, Christopher REFERRING Grady, Christopher ATTENDING Tobias Alexander, M.D. cc:  ------------------------------------------------------------ LV EF: 55% - 60%  ------------------------------------------------------------ Indications: Chest pain 786.51. Murmur 785.2.  ------------------------------------------------------------ History: PMH: Acquired from the patient and from the patient's chart. Chest pain. Murmur and bilateral lower extremity edema. Risk factors: Hypertension.  ------------------------------------------------------------ Study Conclusions  - Left ventricle: The cavity size was normal. Wall thickness was increased in a pattern of mild LVH. Systolic function was normal. The estimated ejection fraction was in the range of 55% to 60%. - Aortic valve: There was severe stenosis. Mild regurgitation. - Mitral valve: Mild regurgitation. - Left atrium: The atrium was mildly dilated. - Atrial septum: No defect or patent foramen ovale was identified. - Pulmonary arteries: PA peak pressure: 37mm Hg (S). Transthoracic echocardiography. M-mode, complete 2D, spectral Doppler, and color Doppler. Height: Height: 160cm. Height: 63in.  Weight: Weight: 69.4kg. Weight: 152.7lb. Body mass index: BMI: 27.1kg/m^2. Body surface area: BSA: 1.37m^2. Blood pressure: 150/70. Patient status: Outpatient. Location: [facility]  ------------------------------------------------------------  ------------------------------------------------------------ Left ventricle: The cavity size was normal. Wall thickness was increased in a pattern of mild LVH. Systolic function was normal. The estimated ejection fraction was in the range of 55% to 60%.  ------------------------------------------------------------ Aortic valve: Severely calcified leaflets. Doppler: There was severe stenosis. Mild regurgitation. VTI ratio of LVOT to aortic valve: 0.18. Valve area: 0.7cm^2(VTI). Indexed valve area: 0.41cm^2/m^2 (VTI). Peak velocity ratio of LVOT to aortic valve: 0.17. Valve area: 0.66cm^2 (Vmax). Indexed valve area: 0.38cm^2/m^2 (Vmax). Mean gradient: 54mm Hg (S). Peak gradient: Hg (S).  ------------------------------------------------------------ Mitral valve: Mildly thickened leaflets . Doppler: Mild regurgitation. Peak gradient: 2mm Hg (D).  ------------------------------------------------------------ Left atrium: The atrium was mildly dilated.  ------------------------------------------------------------ Atrial septum: No defect or patent foramen ovale was identified.  ------------------------------------------------------------ Right ventricle: The cavity size was normal. Wall thickness was normal. Systolic function was normal.  ------------------------------------------------------------ Pulmonic valve: Structurally normal valve. Cusp separation was normal. Doppler: Transvalvular velocity was within the normal range. Mild regurgitation.  ------------------------------------------------------------ Tricuspid valve: Structurally normal valve. Leaflet separation was normal. Doppler: Transvalvular velocity was within  the normal range. Mild regurgitation.  ------------------------------------------------------------ Right atrium: The atrium was normal in size.  ------------------------------------------------------------ Pericardium: The pericardium was normal in appearance.  ------------------------------------------------------------  2D measurements Normal Doppler measurements Norma Left ventricle l LVID ED, 43.7 mm 43-52 Main pulmonary artery chord, Pressure, 37 mm Hg =30 PLAX S LVID ES, 23.8 mm 23-38 Left ventricle chord, Ea, lat 8.4 cm/s ----- PLAX ann, tiss 8 FS, chord, 46 % >29 DP PLAX E/Ea, lat 8.7 ----- LVPW, ED 11.5 mm ------ ann, tiss 4 IVS/LVPW 1.14 <1.3 DP ratio, ED Ea, med 5.4 cm/s ----- Ventricular septum ann, tiss 6 IVS, ED 13.1 mm ------ DP LVOT E/Ea, med 13. ----- Diam, S 22 mm ------ ann, tiss 57 Area 3.8 cm^2 ------ DP Diam 22 mm ------ LVOT Aorta Peak vel, 90. cm/s ----- Root diam, 32 mm ------ S 2 ED VTI, S 26 cm ----- Left atrium HR 57 bpm ----- AP dim 41 mm ------ Stroke vol 98. ml ----- AP dim 2.37 cm/m^2 <2.2 8 index Cardiac 5.6 L/min ----- output Cardiac 3.3 L/(min-m ----- index ^2) Stroke 57. ml/m^2 ----- index 1 Aortic valve Peak vel, 516 cm/s ----- S Mean vel, 334 cm/s ----- S VTI, S 141 cm ----- Mean 54 mm Hg ----- gradient, S Peak 107 mm Hg ----- gradient, S VTI ratio 0.1 ----- LVOT/AV 8 Area, VTI 0.7 cm^2 ----- Area index 0.4 cm^2/m^2 ----- (VTI) 1 Peak vel 0.1 ----- ratio, 7 LVOT/AV Area, Vmax 0.6 cm^2 ----- 6 Area index 0.3 cm^2/m^2 ----- (Vmax) 8 Regurg PHT 408 ms ----- Mitral valve Peak E vel 74. cm/s ----- 1 Peak A vel 91. cm/s ----- 3 Decelerati 384 ms 150-2 on time 30 Peak 2 mm Hg ----- gradient, D Peak E/A 0.8 ----- ratio Tricuspid valve Regurg 282 cm/s ----- peak vel Peak RV-RA 32 mm Hg ----- gradient, S Systemic veins Estimated 5 mm Hg ----- CVP Right ventricle Pressure, 37 mm Hg <30 S Sa vel, 12.  cm/s ----- lat ann, 4 tiss DP  ------------------------------------------------------------ Prepared and Electronically Authenticated by  Charlton Haws 2014-10-20T17:44:47.713     ...................................................................................................................................Marland Kitchen   [facility] NUCLEAR MED  [address] [place], [place] [address]  [phone]    Cardiology Nuclear Med Study  Laurie Lambert is a 57 y.o. female MRN : [id number] DOB: Sep 12, 1956  Procedure Date: 07/25/2013  Nuclear Med Background  Indication for Stress Test: Evaluation for Ischemia and Abnormal EKG  History: No prior known history of CAD; 07-25-13 Echo: Severe AS  Cardiac Risk Factors: Hypertension and Lipids  Symptoms: Chest Pain (patient says she is currently having an 8/10 stabbing chest pain from front to back. When questioned a second time she stated she did NOT currently have any pain) and DOE  Nuclear Pre-Procedure  Caffeine/Decaff Intake: None> 12 hrs  NPO After: 8:30pm   Lungs: clear  O2 Sat: 97% on room air.  IV 0.9% NS with Angio Cath: 22g   IV Site: R Antecubital x 1, tolerated well  IV Started by: Irean Hong, RN   Chest Size (in): 34  Cup Size: C   Height: 5\' 3"  (1.6 m)  Weight: 151 lb (68.493 kg)   BMI: Body mass index is 26.76 kg/(m^2).  Tech Comments: N/A   Nuclear Med Study  1 or 2 day study: 1 day  Stress Test Type: Eugenie Birks   Reading MD: Tobias Alexander, MD  Order Authorizing Provider: Verne Carrow, MD   Resting Radionuclide: Technetium 69m Sestamibi  Resting Radionuclide Dose: 10.7 mCi   Stress Radionuclide: Technetium 58m Sestamibi  Stress Radionuclide Dose: 33.0 mCi   Stress Protocol  Rest HR: 67  Stress HR: 100   Rest BP: 139/72  Stress BP: 140/61   Exercise Time (min): n/a  METS: n/a   Predicted Max HR: 165 bpm  % Max HR: 60.61 bpm  Rate Pressure Product: 45409  Dose of Adenosine (mg): n/a  Dose of  Lexiscan: 0.4 mg   Dose of Atropine (mg): n/a  Dose of Dobutamine: n/a mcg/kg/min (at max HR)   Stress Test Technologist: Nelson Chimes, BS-ES  Nuclear Technologist: Domenic Polite, CNMT   Rest Procedure: Myocardial perfusion imaging was performed at rest 45 minutes following the intravenous administration of Technetium 64m Sestamibi.  Rest ECG: NSR - Normal EKG  Stress Procedure: The patient received IV Lexiscan 0.4 mg over 15-seconds. Technetium 3m Sestamibi injected at 30-seconds. Quantitative spect images were obtained after a 45 minute delay.Patient felt like her heart was racing with Lexiscan that resolved in recovery.  Stress ECG: No significant change from baseline ECG  QPS  Raw Data Images: Normal; no motion artifact; normal heart/lung ratio.  Stress Images: There is decreased perfusion in the mid and basal inferolateral and basal inferior and inferoseptal walls.  Rest Images: There is a perfusion defect in the basal inferolateral wall.  Subtraction (SDS): There is a medium size moderate severity reversible perfusion defect in the mid and basal inferolaterla, basal inferior and inferoseptal walls.  Transient Ischemic Dilatation (Normal <1.22): 1.10  Lung/Heart Ratio (Normal <0.45): 0.31  Quantitative Gated Spect Images  QGS EDV: 96 ml  QGS ESV: 42 ml  Impression  Exercise Capacity: Lexiscan with no exercise.  BP Response: Normal blood pressure response.  Clinical Symptoms: No significant symptoms noted.  ECG Impression: No significant ST segment change suggestive of ischemia.  Comparison with Prior Nuclear Study: No images to compare  Overall Impression: Intermediate risk stress nuclear study with is a medium size moderate severity reversible perfusion defect in the RCA territory.  LV Ejection Fraction: 56%. LV Wall Motion: NL LV Function; there is hypokinesis of the basal inferior and basal and inferolateral inferolateral walls.  Tobias Alexander, H  07/25/2013     ....................................................................................................................................   [facility]* *[facility]* [address] [place], [place] [address] [phone]  ------------------------------------------------------------ Transesophageal Echocardiography  Patient: Laurie Lambert, Laurie Lambert MR #: [id number] Study Date: 08/12/2013 Gender: F Age: 20 Height: 160cm Weight: 67.1kg BSA: 1.76m^2 Pt. Status: Room: [facility]  SONOGRAPHER Georgian Co, RDCS, CCT Lovey Newcomer, Christopher ADMITTING Tobias Alexander, M.D. ATTENDING Tobias Alexander, M.D. PERFORMING Tobias Alexander, M.D. cc:  ------------------------------------------------------------  ------------------------------------------------------------ Indications: Aortic stenosis 424.1.  ------------------------------------------------------------ Study Conclusions  - Left ventricle: The cavity size was normal. Wall thickness was normal. Systolic function was normal. Wall motion was normal; there were no regional wall motion abnormalities. - Aortic valve: Valve area: 0.79cm^2(VTI). Valve area: 0.74cm^2 (Vmax). - Mitral valve: No evidence of vegetation. Mild regurgitation. - Left atrium: The atrium was dilated. No evidence of thrombus in the atrial cavity or appendage. - Right atrium: No evidence of thrombus in the atrial cavity or appendage. - Tricuspid valve: No evidence of vegetation. - Pulmonic valve: No evidence of vegetation. Transesophageal echocardiography. 2D and color Doppler. Height: Height: 160cm. Height: 63in. Weight: Weight: 67.1kg. Weight: 147.7lb. Body mass index: BMI: 26.2kg/m^2. Body surface area: BSA: 1.23m^2. Blood pressure: 178/51. Patient status: Outpatient. Location: Endoscopy.  ------------------------------------------------------------  ------------------------------------------------------------ Left ventricle: The  cavity size was normal. Wall thickness was normal. Systolic function was normal. Wall motion was normal; there were no regional wall motion abnormalities.  ------------------------------------------------------------ Aortic valve: Aortic valve is trileaflet, severely  calcified with restricted leaflet opening. Non-coronary cusp is fixed. AVA by planimetry 0.7 cm2, by continuity equation 0.7 cm2. Max gradient 61 mmHg, mean 38 mmHg, underestimated by suboptimal angle. Severe aortic stenosis, moderate aortic regurgitation. Doppler: VTI ratio of LVOT to aortic valve: 0.25. Valve area: 0.79cm^2(VTI). Indexed valve area: 0.46cm^2/m^2 (VTI). Peak velocity ratio of LVOT to aortic valve: 0.24. Valve area: 0.74cm^2 (Vmax). Indexed valve area: 0.44cm^2/m^2 (Vmax). Mean gradient: 35mm Hg (S). Peak gradient: 57mm Hg (S).  ------------------------------------------------------------ Aorta: Minimal atherosclerotic disease.  ------------------------------------------------------------ Mitral valve: Structurally normal valve. Leaflet separation was normal. No evidence of vegetation. Doppler: Mild regurgitation.  ------------------------------------------------------------ Left atrium: The atrium was dilated. No evidence of thrombus in the atrial cavity or appendage.  ------------------------------------------------------------ Pulmonic valve: Structurally normal valve. Cusp separation was normal. No evidence of vegetation.  ------------------------------------------------------------ Tricuspid valve: Structurally normal valve. Leaflet separation was normal. No evidence of vegetation. Doppler: Trivial regurgitation.  ------------------------------------------------------------ Right atrium: The atrium was normal in size. No evidence of thrombus in the atrial cavity or appendage.  ------------------------------------------------------------ Pericardium: There was no pericardial  effusion.  ------------------------------------------------------------ Post procedure conclusions Ascending Aorta:  - Minimal atherosclerotic disease.  ------------------------------------------------------------  2D measurements Normal Doppler measurements Norma LVOT l Diam, S 20 mm ------ LVOT Area 3.14 cm^2 ------ Peak vel, 88. cm/s ----- S 7 VTI, S 24 cm ----- Aortic valve Peak vel, 376 cm/s ----- S Mean vel, 275 cm/s ----- S VTI, S 95. cm ----- 4 Mean 35 mm Hg ----- gradient, S Peak 57 mm Hg ----- gradient, S VTI ratio 0.2 ----- LVOT/AV 5 Area, VTI 0.7 cm^2 ----- 9 Area 0.4 cm^2/m^2 ----- index 6 (VTI) Peak vel 0.2 ----- ratio, 4 LVOT/AV Area, 0.7 cm^2 ----- Vmax 4 Area 0.4 cm^2/m^2 ----- index 4 (Vmax) Regurg 344 ms ----- PHT  ------------------------------------------------------------ Prepared and Electronically Authenticated by  Tobias Alexander, M.D. 2014-11-07T19:05:54.890     ....................................................................................................................................   Cardiac Catheterization Operative Report  Laurie Lambert  [id number]  11/7/201411:56 AM  Pcp Not In System  Procedure Performed:  1. Left Heart Catheterization 2. Selective Coronary Angiography 3. Right Heart Catheterization 4. Left ventricular pressures Operator: Verne Carrow, MD  Indication: 57 yo female with history of HTN, severe aortic stenosis, LE edema, chest pain and abnormal stress myoview here today for cardiac cath to define coronary anatomy and assess valve/pressures before surgical referral for severe AS. TEE this am.  Procedure Details:  The risks, benefits, complications, treatment options, and expected outcomes were discussed with the patient. The patient and/or family concurred with the proposed plan, giving informed consent. The patient was brought to the cath lab after IV hydration was begun and oral  premedication was given. The patient was further sedated with Versed and Fentanyl. The right gorin was prepped and draped in the usual manner. Using the modified Seldinger access technique, a 5 French sheath was placed in the right femoral artery. A 6 French sheath was inserted into the right femoral vein. A multi-purpose catheter was used to perform a right heart catheterization. Standard diagnostic catheters were used to perform selective coronary angiography. An AL-1 catheter was used with a soft straight wire to cross the aortic valve. There were no immediate complications. The patient was taken to the recovery area in stable condition.  Hemodynamic Findings:  Ao: 13859  LV: 197/9/22  RA: 9  RV: 36/7/11  PA: 34/11 (mean 22)  PCWP: 17  Fick Cardiac Output: 4.96 L/min  Fick Cardiac Index: 2.92 L/min/m2  Central Aortic Saturation: 94%  Pulmonary  Artery Saturation: 68%  Aortic valve data: Peak to peak gradient 56 mm Hg  Mean gradient 46 mm Hg  AVA 0.89 cm2  AVA index 0.52  Angiographic Findings:  Left main: No obstructive disease.  Left Anterior Descending Artery: Large caliber vessel that courses to the apex. 50% mid stenosis. 2 small caliber diagonal branches.  Circumflex Artery: Large caliber vessel with termination into a large obtuse marginal branch. Mild plaque disease in the obtuse marginal branch.  Right Coronary Artery: Large dominant vessel with long 99% stenosis of the mid vessel followed by 99% stenosis in the distal vessel. The posterolateral artery and the PDA is small to moderate in caliber with mild diffuse plaque disease. The distal vessel is seen to fill also from left to right collaterals.  Left Ventricular Angiogram: Deferred.  Impression:  1. Double vessel CAD  2. Severe aortic valve stenosis.  3. Recent chest pain, dyspnea, lower extremity edema.  Recommendations: TEE this am confirming severely calcified aortic valve. She is symptomatic from her valvular disease and  her CAD. Will refer to CT surgery for evaluation for AVR and bypass. Pt is here with her husband and an interpretor and expresses an understanding of the plan.  Complications: None; patient tolerated the procedure well.     Impression:  She has severe aortic stenosis and 2-vessel coronary disease with moderate LAD stenosis and high grade RCA stenosis with inferior ischemia on myoview exam. She has mild symptoms although I think she may minimize them because she is used to working hard. I think she needs AVR and CABG to the LAD and RCA to prevent further ischemia, infarction, and congestive heart failure symptoms. I discussed the pros and cons of mechanical and tissue valves. 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 I would recommend a tissue valve which should give her good longevity with much less risk. She is in agreement with using a tissue valve. I discussed the operative procedure with the patient including alternatives, benefits and risks; including but not limited to bleeding, blood transfusion, infection, stroke, myocardial infarction, graft failure, heart block requiring a permanent pacemaker, organ dysfunction, and death.  Laurie Lambert understands and agrees to proceed. We have scheduled surgery for Thursday 09/08/2013.  Plan:  AVR with a tissue valve and CABG on 09/08/2013.

## 2013-09-05 ENCOUNTER — Other Ambulatory Visit: Payer: Self-pay | Admitting: *Deleted

## 2013-09-05 DIAGNOSIS — I2510 Atherosclerotic heart disease of native coronary artery without angina pectoris: Secondary | ICD-10-CM

## 2013-09-05 DIAGNOSIS — I359 Nonrheumatic aortic valve disorder, unspecified: Secondary | ICD-10-CM

## 2013-09-08 ENCOUNTER — Inpatient Hospital Stay: Admit: 2013-09-08 | Payer: Self-pay | Admitting: Surgery

## 2013-09-08 SURGERY — REPLACEMENT, AORTIC VALVE, OPEN
Anesthesia: General | Site: Chest

## 2013-09-09 ENCOUNTER — Other Ambulatory Visit: Payer: Self-pay | Admitting: *Deleted

## 2013-09-09 ENCOUNTER — Telehealth: Payer: Self-pay | Admitting: Cardiovascular Disease

## 2013-09-09 DIAGNOSIS — I359 Nonrheumatic aortic valve disorder, unspecified: Secondary | ICD-10-CM

## 2013-09-09 DIAGNOSIS — I2510 Atherosclerotic heart disease of native coronary artery without angina pectoris: Secondary | ICD-10-CM

## 2013-09-09 NOTE — Telephone Encounter (Signed)
New problem:  Pt's caregiver is calling to set up surgery for the pt. She states any day after Dec 24th will work. Jacalyn Lefevre is requesting a call back.

## 2013-09-09 NOTE — Telephone Encounter (Signed)
Called [employer].  Informed they need to call Dr. Sharee Pimple office to reschedule patients surgery. Verbalized understanding.

## 2013-09-12 ENCOUNTER — Other Ambulatory Visit: Payer: Self-pay | Admitting: *Deleted

## 2013-09-12 DIAGNOSIS — I2510 Atherosclerotic heart disease of native coronary artery without angina pectoris: Secondary | ICD-10-CM

## 2013-09-12 DIAGNOSIS — I359 Nonrheumatic aortic valve disorder, unspecified: Secondary | ICD-10-CM

## 2013-09-15 ENCOUNTER — Encounter: Payer: BC Managed Care – PPO | Admitting: Cardiovascular Disease

## 2013-10-07 ENCOUNTER — Ambulatory Visit (HOSPITAL_COMMUNITY)
Admission: RE | Admit: 2013-10-07 | Discharge: 2013-10-07 | Disposition: A | Discharge status: Home / Self Care | Payer: BC Managed Care – PPO | Source: Ambulatory Visit | Attending: Surgery | Admitting: Surgery

## 2013-10-07 ENCOUNTER — Encounter (HOSPITAL_COMMUNITY)
Admission: RE | Admit: 2013-10-07 | Discharge: 2013-10-07 | Disposition: A | Discharge status: Home / Self Care | Payer: BC Managed Care – PPO | Source: Ambulatory Visit | Attending: Surgery | Admitting: Surgery

## 2013-10-07 ENCOUNTER — Encounter (HOSPITAL_COMMUNITY): Payer: Self-pay

## 2013-10-07 ENCOUNTER — Encounter (HOSPITAL_COMMUNITY): Payer: BC Managed Care – PPO

## 2013-10-07 VITALS — BP 149/75 | HR 66 | Temp 98.0°F | Resp 20 | Ht 60.0 in | Wt 149.4 lb

## 2013-10-07 DIAGNOSIS — I2510 Atherosclerotic heart disease of native coronary artery without angina pectoris: Secondary | ICD-10-CM

## 2013-10-07 DIAGNOSIS — Z01818 Encounter for other preprocedural examination: Secondary | ICD-10-CM | POA: Insufficient documentation

## 2013-10-07 DIAGNOSIS — I359 Nonrheumatic aortic valve disorder, unspecified: Secondary | ICD-10-CM

## 2013-10-07 DIAGNOSIS — Z01812 Encounter for preprocedural laboratory examination: Secondary | ICD-10-CM | POA: Insufficient documentation

## 2013-10-07 DIAGNOSIS — R011 Cardiac murmur, unspecified: Secondary | ICD-10-CM

## 2013-10-07 DIAGNOSIS — Z01810 Encounter for preprocedural cardiovascular examination: Secondary | ICD-10-CM

## 2013-10-07 LAB — PULMONARY FUNCTION TEST
DL/VA % PRED: 163 %
DL/VA: 6.91 ml/min/mmHg/L
DLCO cor % pred: 119 %
DLCO cor: 22.62 ml/min/mmHg
DLCO unc % pred: 119 %
DLCO unc: 22.62 ml/min/mmHg
FEF 25-75 Post: 2.46 L/sec
FEF 25-75 Pre: 1.84 L/sec
FEF2575-%CHANGE-POST: 33 %
FEF2575-%PRED-POST: 102 %
FEF2575-%Pred-Pre: 76 %
FEV1-%CHANGE-POST: 6 %
FEV1-%PRED-PRE: 81 %
FEV1-%Pred-Post: 86 %
FEV1-POST: 1.96 L
FEV1-PRE: 1.84 L
FEV1FVC-%CHANGE-POST: 4 %
FEV1FVC-%PRED-PRE: 99 %
FEV6-%Change-Post: 1 %
FEV6-%Pred-Post: 84 %
FEV6-%Pred-Pre: 83 %
FEV6-Post: 2.33 L
FEV6-Pre: 2.3 L
FEV6FVC-%Change-Post: 0 %
FEV6FVC-%PRED-PRE: 102 %
FEV6FVC-%Pred-Post: 103 %
FVC-%Change-Post: 1 %
FVC-%Pred-Post: 82 %
FVC-%Pred-Pre: 80 %
FVC-Post: 2.34 L
FVC-Pre: 2.3 L
POST FEV1/FVC RATIO: 84 %
PRE FEV6/FVC RATIO: 100 %
Post FEV6/FVC ratio: 100 %
Pre FEV1/FVC ratio: 80 %
RV % PRED: 83 %
RV: 1.43 L
TLC % pred: 85 %
TLC: 3.82 L

## 2013-10-07 LAB — BLOOD GAS, ARTERIAL
ACID-BASE EXCESS: 1.1 mmol/L (ref 0.0–2.0)
Bicarbonate: 25.2 mEq/L — ABNORMAL HIGH (ref 20.0–24.0)
FIO2: 0.21 %
O2 Saturation: 96.5 %
Patient temperature: 98.6
TCO2: 26.4 mmol/L (ref 0–100)
pCO2 arterial: 39.5 mmHg (ref 35.0–45.0)
pH, Arterial: 7.42 (ref 7.350–7.450)
pO2, Arterial: 86.9 mmHg (ref 80.0–100.0)

## 2013-10-07 LAB — CBC
HEMATOCRIT: 36.8 % (ref 36.0–46.0)
Hemoglobin: 12.2 g/dL (ref 12.0–15.0)
MCH: 27.3 pg (ref 26.0–34.0)
MCHC: 33.2 g/dL (ref 30.0–36.0)
MCV: 82.3 fL (ref 78.0–100.0)
Platelets: 228 10*3/uL (ref 150–400)
RBC: 4.47 MIL/uL (ref 3.87–5.11)
RDW: 15.4 % (ref 11.5–15.5)
WBC: 4.8 10*3/uL (ref 4.0–10.5)

## 2013-10-07 LAB — URINALYSIS, ROUTINE W REFLEX MICROSCOPIC
Bilirubin Urine: NEGATIVE
GLUCOSE, UA: NEGATIVE mg/dL
HGB URINE DIPSTICK: NEGATIVE
Ketones, ur: NEGATIVE mg/dL
Leukocytes, UA: NEGATIVE
Nitrite: NEGATIVE
Protein, ur: NEGATIVE mg/dL
SPECIFIC GRAVITY, URINE: 1.026 (ref 1.005–1.030)
Urobilinogen, UA: 0.2 mg/dL (ref 0.0–1.0)
pH: 6.5 (ref 5.0–8.0)

## 2013-10-07 LAB — COMPREHENSIVE METABOLIC PANEL
ALBUMIN: 3.6 g/dL (ref 3.5–5.2)
ALT: 14 U/L (ref 0–35)
AST: 21 U/L (ref 0–37)
Alkaline Phosphatase: 76 U/L (ref 39–117)
BUN: 16 mg/dL (ref 6–23)
CO2: 23 meq/L (ref 19–32)
CREATININE: 0.48 mg/dL — AB (ref 0.50–1.10)
Calcium: 9 mg/dL (ref 8.4–10.5)
Chloride: 103 mEq/L (ref 96–112)
GLUCOSE: 93 mg/dL (ref 70–99)
POTASSIUM: 3.7 meq/L (ref 3.7–5.3)
Sodium: 140 mEq/L (ref 137–147)
Total Bilirubin: <0.2 mg/dL — ABNORMAL LOW (ref 0.3–1.2)
Total Protein: 7.9 g/dL (ref 6.0–8.3)

## 2013-10-07 LAB — TYPE AND SCREEN
ABO/RH(D): A POS
Antibody Screen: NEGATIVE

## 2013-10-07 LAB — PROTIME-INR
INR: 1.04 (ref 0.00–1.49)
Prothrombin Time: 13.4 seconds (ref 11.6–15.2)

## 2013-10-07 LAB — HEMOGLOBIN A1C
HEMOGLOBIN A1C: 6 % — AB (ref <5.7)
MEAN PLASMA GLUCOSE: 126 mg/dL — AB (ref <117)

## 2013-10-07 LAB — SURGICAL PCR SCREEN
MRSA, PCR: NEGATIVE
STAPHYLOCOCCUS AUREUS: NEGATIVE

## 2013-10-07 LAB — ABO/RH: ABO/RH(D): A POS

## 2013-10-07 LAB — APTT: aPTT: 30 seconds (ref 24–37)

## 2013-10-07 MED ORDER — ALBUTEROL SULFATE (2.5 MG/3ML) 0.083% IN NEBU
2.5000 mg | INHALATION_SOLUTION | Freq: Once | RESPIRATORY_TRACT | Status: AC
  Administered 2013-10-07: 2.5 mg via RESPIRATORY_TRACT
  Filled 2013-10-07: qty 3

## 2013-10-07 NOTE — Progress Notes (Signed)
>   1 hour spent with pt. Today, she was unclear about several points of instruction & requires total interpretation, with translator.

## 2013-10-07 NOTE — Pre-Procedure Instructions (Signed)
Laurie Lambert  10/07/2013   Your procedure is scheduled on:  10/10/2013  Report to [place]  2 * 3   Entrance A- report to admitting a    5:30 AM.  Call this number if you have problems the morning of surgery: [phone]   Remember:   Do not eat food or drink liquids after midnight.  On Sunday   Take these medicines the morning of surgery with A SIP OF WATER: none   Do not wear jewelry, make-up or nail polish.  Do not wear lotions, powders, or perfumes. You may wear deodorant.  Do not shave 48 hours prior to surgery.    Do not bring valuables to the hospital.  [facility] is not responsible                  for any belongings or valuables.               Contacts, dentures or bridgework may not be worn into surgery.  Leave suitcase in the car. After surgery it may be brought to your room.  For patients admitted to the hospital, discharge time is determined by your                treatment team.               Patients discharged the day of surgery will not be allowed to drive  home.  Name and phone number of your driver: with spouse   Special Instructions: Shower using CHG 2 nights before surgery and the night before surgery.  If you shower the day of surgery use CHG.  Use special wash - you have one bottle of CHG for all showers.  You should use approximately 1/3 of the bottle for each shower.   Please read over the following fact sheets that you were given: Pain Booklet, Coughing and Deep Breathing, Blood Transfusion Information, MRSA Information and Surgical Site Infection Prevention

## 2013-10-07 NOTE — Progress Notes (Signed)
VASCULAR LAB PRELIMINARY  PRELIMINARY  PRELIMINARY  PRELIMINARY  Pre-op Cardiac Surgery  Carotid Findings:  Bilateral:  1-39% ICA stenosis.  Vertebral artery flow is antegrade.      Upper Extremity Right Left  Brachial Pressures 179 triphasic 179 triphasic  Radial Waveforms triphasic triphasic  Ulnar Waveforms triphasic triphasic  Palmar Arch (Allen's Test) WNL WNL   Findings:  Doppler waveforms remain normal with ulnar and radial compressions bilaterally.    Lower  Extremity Right Left  Dorsalis Pedis    Anterior Tibial    Posterior Tibial    Ankle/Brachial Indices      Findings:  Palpable pedal pulses x 4.   Lilliam Chamblee, RVT 10/07/2013, 11:04 AM

## 2013-10-09 ENCOUNTER — Encounter (HOSPITAL_COMMUNITY): Payer: Self-pay | Admitting: Certified Registered Nurse Anesthetist

## 2013-10-09 MED ORDER — DEXMEDETOMIDINE HCL IN NACL 400 MCG/100ML IV SOLN
0.1000 ug/kg/h | INTRAVENOUS | Status: DC
  Filled 2013-10-09: qty 100

## 2013-10-09 MED ORDER — DOPAMINE-DEXTROSE 3.2-5 MG/ML-% IV SOLN
2.0000 ug/kg/min | INTRAVENOUS | Status: DC
  Filled 2013-10-09: qty 250

## 2013-10-09 MED ORDER — EPINEPHRINE HCL 1 MG/ML IJ SOLN
0.5000 ug/min | INTRAVENOUS | Status: DC
  Filled 2013-10-09: qty 4

## 2013-10-09 MED ORDER — PLASMA-LYTE 148 IV SOLN
INTRAVENOUS | Status: AC
  Administered 2013-10-10: 09:00:00
  Filled 2013-10-09: qty 2.5

## 2013-10-09 MED ORDER — SODIUM CHLORIDE 0.9 % IV SOLN
INTRAVENOUS | Status: DC
  Filled 2013-10-09: qty 1

## 2013-10-09 MED ORDER — DEXTROSE 5 % IV SOLN
750.0000 mg | INTRAVENOUS | Status: DC
  Filled 2013-10-09: qty 750

## 2013-10-09 MED ORDER — VANCOMYCIN HCL 10 G IV SOLR
1250.0000 mg | INTRAVENOUS | Status: AC
  Administered 2013-10-10: 1250 mg via INTRAVENOUS
  Filled 2013-10-09: qty 1250

## 2013-10-09 MED ORDER — SODIUM CHLORIDE 0.9 % IV SOLN
INTRAVENOUS | Status: DC
  Filled 2013-10-09: qty 30

## 2013-10-09 MED ORDER — NITROGLYCERIN IN D5W 200-5 MCG/ML-% IV SOLN
2.0000 ug/min | INTRAVENOUS | Status: DC
  Filled 2013-10-09: qty 250

## 2013-10-09 MED ORDER — POTASSIUM CHLORIDE 2 MEQ/ML IV SOLN
80.0000 meq | INTRAVENOUS | Status: DC
  Filled 2013-10-09: qty 40

## 2013-10-09 MED ORDER — DEXTROSE 5 % IV SOLN
1.5000 g | INTRAVENOUS | Status: AC
  Administered 2013-10-10: 1.5 g via INTRAVENOUS
  Administered 2013-10-10: .75 g via INTRAVENOUS
  Filled 2013-10-09: qty 1.5

## 2013-10-09 MED ORDER — SODIUM CHLORIDE 0.9 % IV SOLN
INTRAVENOUS | Status: DC
  Filled 2013-10-09: qty 40

## 2013-10-09 MED ORDER — PHENYLEPHRINE HCL 10 MG/ML IJ SOLN
30.0000 ug/min | INTRAVENOUS | Status: DC
  Filled 2013-10-09: qty 2

## 2013-10-09 MED ORDER — MAGNESIUM SULFATE 50 % IJ SOLN
40.0000 meq | INTRAMUSCULAR | Status: DC
  Filled 2013-10-09: qty 10

## 2013-10-10 ENCOUNTER — Inpatient Hospital Stay (HOSPITAL_COMMUNITY)
Admission: RE | Admit: 2013-10-10 | Discharge: 2013-10-17 | DRG: 220 | Disposition: A | Discharge status: Home / Self Care | Payer: BC Managed Care – PPO | Source: Ambulatory Visit | Attending: Surgery | Admitting: Surgery

## 2013-10-10 ENCOUNTER — Inpatient Hospital Stay (HOSPITAL_COMMUNITY): Payer: BC Managed Care – PPO | Admitting: Certified Registered Nurse Anesthetist

## 2013-10-10 ENCOUNTER — Encounter (HOSPITAL_COMMUNITY): Payer: Self-pay | Admitting: Certified Registered Nurse Anesthetist

## 2013-10-10 ENCOUNTER — Encounter (HOSPITAL_COMMUNITY)
Admission: RE | Disposition: A | Discharge status: Home / Self Care | Payer: BC Managed Care – PPO | Source: Ambulatory Visit | Attending: Surgery

## 2013-10-10 ENCOUNTER — Encounter (HOSPITAL_COMMUNITY): Payer: BC Managed Care – PPO | Admitting: Certified Registered Nurse Anesthetist

## 2013-10-10 ENCOUNTER — Inpatient Hospital Stay (HOSPITAL_COMMUNITY): Payer: BC Managed Care – PPO

## 2013-10-10 DIAGNOSIS — I10 Essential (primary) hypertension: Secondary | ICD-10-CM | POA: Diagnosis present

## 2013-10-10 DIAGNOSIS — I2510 Atherosclerotic heart disease of native coronary artery without angina pectoris: Secondary | ICD-10-CM

## 2013-10-10 DIAGNOSIS — I359 Nonrheumatic aortic valve disorder, unspecified: Secondary | ICD-10-CM

## 2013-10-10 DIAGNOSIS — D62 Acute posthemorrhagic anemia: Secondary | ICD-10-CM | POA: Diagnosis not present

## 2013-10-10 DIAGNOSIS — E785 Hyperlipidemia, unspecified: Secondary | ICD-10-CM | POA: Diagnosis present

## 2013-10-10 DIAGNOSIS — D696 Thrombocytopenia, unspecified: Secondary | ICD-10-CM | POA: Diagnosis not present

## 2013-10-10 DIAGNOSIS — Z952 Presence of prosthetic heart valve: Secondary | ICD-10-CM

## 2013-10-10 DIAGNOSIS — R011 Cardiac murmur, unspecified: Secondary | ICD-10-CM | POA: Diagnosis present

## 2013-10-10 DIAGNOSIS — E8779 Other fluid overload: Secondary | ICD-10-CM | POA: Diagnosis not present

## 2013-10-10 DIAGNOSIS — I4891 Unspecified atrial fibrillation: Secondary | ICD-10-CM | POA: Diagnosis present

## 2013-10-10 HISTORY — PX: CORONARY ARTERY BYPASS GRAFT: SHX141

## 2013-10-10 HISTORY — PX: INTRAOPERATIVE TRANSESOPHAGEAL ECHOCARDIOGRAM: SHX5062

## 2013-10-10 HISTORY — DX: Atherosclerotic heart disease of native coronary artery without angina pectoris: I25.10

## 2013-10-10 HISTORY — PX: AORTIC VALVE REPLACEMENT: SHX41

## 2013-10-10 LAB — POCT I-STAT 4, (NA,K, GLUC, HGB,HCT)
GLUCOSE: 107 mg/dL — AB (ref 70–99)
GLUCOSE: 102 mg/dL — AB (ref 70–99)
Glucose, Bld: 102 mg/dL — ABNORMAL HIGH (ref 70–99)
Glucose, Bld: 115 mg/dL — ABNORMAL HIGH (ref 70–99)
Glucose, Bld: 104 mg/dL — ABNORMAL HIGH (ref 70–99)
HCT: 30 % — ABNORMAL LOW (ref 36.0–46.0)
HCT: 26 % — ABNORMAL LOW (ref 36.0–46.0)
HEMATOCRIT: 37 % (ref 36.0–46.0)
HEMATOCRIT: 26 % — AB (ref 36.0–46.0)
HEMATOCRIT: 27 % — AB (ref 36.0–46.0)
HEMOGLOBIN: 8.8 g/dL — AB (ref 12.0–15.0)
Hemoglobin: 12.6 g/dL (ref 12.0–15.0)
Hemoglobin: 10.2 g/dL — ABNORMAL LOW (ref 12.0–15.0)
Hemoglobin: 9.2 g/dL — ABNORMAL LOW (ref 12.0–15.0)
Hemoglobin: 8.8 g/dL — ABNORMAL LOW (ref 12.0–15.0)
POTASSIUM: 3.7 meq/L (ref 3.7–5.3)
Potassium: 3.3 mEq/L — ABNORMAL LOW (ref 3.7–5.3)
Potassium: 3.2 mEq/L — ABNORMAL LOW (ref 3.7–5.3)
Potassium: 4.3 mEq/L (ref 3.7–5.3)
Potassium: 3.3 mEq/L — ABNORMAL LOW (ref 3.7–5.3)
SODIUM: 141 meq/L (ref 137–147)
SODIUM: 141 meq/L (ref 137–147)
Sodium: 141 mEq/L (ref 137–147)
Sodium: 138 mEq/L (ref 137–147)
Sodium: 142 mEq/L (ref 137–147)

## 2013-10-10 LAB — CBC
HCT: 26.8 % — ABNORMAL LOW (ref 36.0–46.0)
HCT: 27.7 % — ABNORMAL LOW (ref 36.0–46.0)
HEMOGLOBIN: 9.2 g/dL — AB (ref 12.0–15.0)
Hemoglobin: 8.9 g/dL — ABNORMAL LOW (ref 12.0–15.0)
MCH: 27.6 pg (ref 26.0–34.0)
MCH: 27.7 pg (ref 26.0–34.0)
MCHC: 33.2 g/dL (ref 30.0–36.0)
MCHC: 33.2 g/dL (ref 30.0–36.0)
MCV: 83.2 fL (ref 78.0–100.0)
MCV: 83.4 fL (ref 78.0–100.0)
PLATELETS: 114 10*3/uL — AB (ref 150–400)
Platelets: 129 10*3/uL — ABNORMAL LOW (ref 150–400)
RBC: 3.22 MIL/uL — ABNORMAL LOW (ref 3.87–5.11)
RBC: 3.32 MIL/uL — ABNORMAL LOW (ref 3.87–5.11)
RDW: 15.4 % (ref 11.5–15.5)
RDW: 15.5 % (ref 11.5–15.5)
WBC: 8 10*3/uL (ref 4.0–10.5)
WBC: 8.7 10*3/uL (ref 4.0–10.5)

## 2013-10-10 LAB — POCT I-STAT 3, ART BLOOD GAS (G3+)
ACID-BASE DEFICIT: 1 mmol/L (ref 0.0–2.0)
Acid-Base Excess: 2 mmol/L (ref 0.0–2.0)
Acid-Base Excess: 3 mmol/L — ABNORMAL HIGH (ref 0.0–2.0)
BICARBONATE: 25.6 meq/L — AB (ref 20.0–24.0)
BICARBONATE: 24.4 meq/L — AB (ref 20.0–24.0)
Bicarbonate: 26.2 mEq/L — ABNORMAL HIGH (ref 20.0–24.0)
Bicarbonate: 27.5 mEq/L — ABNORMAL HIGH (ref 20.0–24.0)
O2 SAT: 100 %
O2 SAT: 99 %
O2 SAT: 100 %
O2 Saturation: 100 %
PCO2 ART: 37.7 mmHg (ref 35.0–45.0)
PCO2 ART: 39.1 mmHg (ref 35.0–45.0)
PH ART: 7.451 — AB (ref 7.350–7.450)
PH ART: 7.456 — AB (ref 7.350–7.450)
PO2 ART: 375 mmHg — AB (ref 80.0–100.0)
PO2 ART: 169 mmHg — AB (ref 80.0–100.0)
PO2 ART: 204 mmHg — AB (ref 80.0–100.0)
Patient temperature: 37.8
TCO2: 27 mmol/L (ref 0–100)
TCO2: 29 mmol/L (ref 0–100)
TCO2: 27 mmol/L (ref 0–100)
TCO2: 26 mmol/L (ref 0–100)
pCO2 arterial: 45.6 mmHg — ABNORMAL HIGH (ref 35.0–45.0)
pCO2 arterial: 43.5 mmHg (ref 35.0–45.0)
pH, Arterial: 7.361 (ref 7.350–7.450)
pH, Arterial: 7.362 (ref 7.350–7.450)
pO2, Arterial: 330 mmHg — ABNORMAL HIGH (ref 80.0–100.0)

## 2013-10-10 LAB — APTT: APTT: 36 s (ref 24–37)

## 2013-10-10 LAB — POCT I-STAT, CHEM 8
BUN: 9 mg/dL (ref 6–23)
CALCIUM ION: 1.23 mmol/L (ref 1.12–1.23)
CREATININE: 0.5 mg/dL (ref 0.50–1.10)
Chloride: 107 mEq/L (ref 96–112)
Glucose, Bld: 114 mg/dL — ABNORMAL HIGH (ref 70–99)
HEMATOCRIT: 27 % — AB (ref 36.0–46.0)
Hemoglobin: 9.2 g/dL — ABNORMAL LOW (ref 12.0–15.0)
Potassium: 3.9 mEq/L (ref 3.7–5.3)
Sodium: 139 mEq/L (ref 137–147)
TCO2: 25 mmol/L (ref 0–100)

## 2013-10-10 LAB — MAGNESIUM: Magnesium: 3.3 mg/dL — ABNORMAL HIGH (ref 1.5–2.5)

## 2013-10-10 LAB — CREATININE, SERUM
CREATININE: 0.48 mg/dL — AB (ref 0.50–1.10)
GFR calc Af Amer: >90 mL/min (ref >90)

## 2013-10-10 LAB — HEMOGLOBIN AND HEMATOCRIT, BLOOD
HCT: 25.4 % — ABNORMAL LOW (ref 36.0–46.0)
HEMOGLOBIN: 8.7 g/dL — AB (ref 12.0–15.0)

## 2013-10-10 LAB — GLUCOSE, CAPILLARY
Glucose-Capillary: 91 mg/dL (ref 70–99)
Glucose-Capillary: 102 mg/dL — ABNORMAL HIGH (ref 70–99)
Glucose-Capillary: 102 mg/dL — ABNORMAL HIGH (ref 70–99)
Glucose-Capillary: 98 mg/dL (ref 70–99)

## 2013-10-10 LAB — PROTIME-INR
INR: 1.69 — AB (ref 0.00–1.49)
PROTHROMBIN TIME: 19.4 s — AB (ref 11.6–15.2)

## 2013-10-10 LAB — PLATELET COUNT: Platelets: 157 10*3/uL (ref 150–400)

## 2013-10-10 SURGERY — REPLACEMENT, AORTIC VALVE, OPEN
Anesthesia: General | Site: Chest

## 2013-10-10 MED ORDER — INSULIN ASPART 100 UNIT/ML ~~LOC~~ SOLN: 0.0000 [IU] | SUBCUTANEOUS | Status: DC

## 2013-10-10 MED ORDER — SODIUM CHLORIDE 0.9 % IJ SOLN
3.0000 mL | Freq: Two times a day (BID) | INTRAMUSCULAR | Status: DC
  Administered 2013-10-11 – 2013-10-13 (×4): 3 mL via INTRAVENOUS

## 2013-10-10 MED ORDER — ACETAMINOPHEN 650 MG RE SUPP
650.0000 mg | Freq: Once | RECTAL | Status: AC
  Administered 2013-10-10: 650 mg via RECTAL

## 2013-10-10 MED ORDER — ASPIRIN 81 MG PO CHEW
324.0000 mg | CHEWABLE_TABLET | Freq: Every day | ORAL | Status: DC
  Filled 2013-10-10: qty 4

## 2013-10-10 MED ORDER — ACETAMINOPHEN 160 MG/5ML PO SOLN: 650.0000 mg | Freq: Once | ORAL | Status: AC

## 2013-10-10 MED ORDER — MIDAZOLAM HCL 2 MG/2ML IJ SOLN: 2.0000 mg | INTRAMUSCULAR | Status: DC | PRN

## 2013-10-10 MED ORDER — THROMBIN 20000 UNITS EX SOLR
OROMUCOSAL | Status: DC | PRN
  Administered 2013-10-10 (×3): via TOPICAL

## 2013-10-10 MED ORDER — METOPROLOL TARTRATE 25 MG/10 ML ORAL SUSPENSION
12.5000 mg | Freq: Two times a day (BID) | ORAL | Status: DC
  Filled 2013-10-10 (×3): qty 5

## 2013-10-10 MED ORDER — ACETAMINOPHEN 160 MG/5ML PO SOLN
1000.0000 mg | Freq: Four times a day (QID) | ORAL | Status: DC
  Filled 2013-10-10: qty 40

## 2013-10-10 MED ORDER — INSULIN REGULAR HUMAN 100 UNIT/ML IJ SOLN
100.0000 [IU] | INTRAMUSCULAR | Status: DC | PRN
  Administered 2013-10-10: 1.4 [IU]/h via INTRAVENOUS

## 2013-10-10 MED ORDER — POTASSIUM CHLORIDE 10 MEQ/50ML IV SOLN
10.0000 meq | INTRAVENOUS | Status: AC
  Administered 2013-10-10 (×3): 10 meq via INTRAVENOUS

## 2013-10-10 MED ORDER — METOPROLOL TARTRATE 12.5 MG HALF TABLET
12.5000 mg | ORAL_TABLET | Freq: Once | ORAL | Status: AC
  Administered 2013-10-10: 12.5 mg via ORAL
  Filled 2013-10-10: qty 1

## 2013-10-10 MED ORDER — VECURONIUM BROMIDE 10 MG IV SOLR
INTRAVENOUS | Status: DC | PRN
  Administered 2013-10-10: 10 mg via INTRAVENOUS
  Administered 2013-10-10 (×2): 5 mg via INTRAVENOUS

## 2013-10-10 MED ORDER — ALBUMIN HUMAN 5 % IV SOLN
INTRAVENOUS | Status: DC | PRN
  Administered 2013-10-10 (×2): via INTRAVENOUS

## 2013-10-10 MED ORDER — LIDOCAINE HCL (CARDIAC) 20 MG/ML IV SOLN
INTRAVENOUS | Status: DC | PRN
  Administered 2013-10-10: 80 mg via INTRAVENOUS

## 2013-10-10 MED ORDER — SODIUM CHLORIDE 0.9 % IV SOLN
INTRAVENOUS | Status: DC
  Administered 2013-10-12: 1 mL via INTRAVENOUS

## 2013-10-10 MED ORDER — INSULIN REGULAR BOLUS VIA INFUSION
0.0000 [IU] | Freq: Three times a day (TID) | INTRAVENOUS | Status: DC
  Filled 2013-10-10: qty 10

## 2013-10-10 MED ORDER — PHENYLEPHRINE HCL 10 MG/ML IJ SOLN
10.0000 mg | INTRAVENOUS | Status: DC | PRN
  Administered 2013-10-10: 10 ug/min via INTRAVENOUS

## 2013-10-10 MED ORDER — SODIUM CHLORIDE 0.9 % IV SOLN
200.0000 ug | INTRAVENOUS | Status: DC | PRN
  Administered 2013-10-10: .3 ug/kg/h via INTRAVENOUS

## 2013-10-10 MED ORDER — METOPROLOL TARTRATE 12.5 MG HALF TABLET
12.5000 mg | ORAL_TABLET | Freq: Two times a day (BID) | ORAL | Status: DC
  Filled 2013-10-10 (×3): qty 1

## 2013-10-10 MED ORDER — FENTANYL CITRATE 0.05 MG/ML IJ SOLN
INTRAMUSCULAR | Status: DC | PRN
  Administered 2013-10-10: 100 ug via INTRAVENOUS
  Administered 2013-10-10 (×6): 50 ug via INTRAVENOUS
  Administered 2013-10-10 (×2): 100 ug via INTRAVENOUS
  Administered 2013-10-10: 50 ug via INTRAVENOUS
  Administered 2013-10-10: 150 ug via INTRAVENOUS
  Administered 2013-10-10 (×2): 50 ug via INTRAVENOUS
  Administered 2013-10-10 (×2): 100 ug via INTRAVENOUS
  Administered 2013-10-10 (×5): 50 ug via INTRAVENOUS
  Administered 2013-10-10: 150 ug via INTRAVENOUS

## 2013-10-10 MED ORDER — MIDAZOLAM HCL 5 MG/5ML IJ SOLN
INTRAMUSCULAR | Status: DC | PRN
  Administered 2013-10-10: 1 mg via INTRAVENOUS
  Administered 2013-10-10: 2 mg via INTRAVENOUS
  Administered 2013-10-10: 3 mg via INTRAVENOUS
  Administered 2013-10-10: 1 mg via INTRAVENOUS
  Administered 2013-10-10: 3 mg via INTRAVENOUS

## 2013-10-10 MED ORDER — CHLORHEXIDINE GLUCONATE 4 % EX LIQD
30.0000 mL | CUTANEOUS | Status: DC
Start: 2013-10-10 — End: 2013-10-10

## 2013-10-10 MED ORDER — LACTATED RINGERS IV SOLN: 500.0000 mL | Freq: Once | INTRAVENOUS | Status: AC | PRN

## 2013-10-10 MED ORDER — LACTATED RINGERS IV SOLN
INTRAVENOUS | Status: DC | PRN
  Administered 2013-10-10 (×2): via INTRAVENOUS

## 2013-10-10 MED ORDER — MORPHINE SULFATE 2 MG/ML IJ SOLN
2.0000 mg | INTRAMUSCULAR | Status: DC | PRN
  Administered 2013-10-11 – 2013-10-12 (×2): 2 mg via INTRAVENOUS
  Filled 2013-10-10: qty 2
  Filled 2013-10-10 (×4): qty 1
  Filled 2013-10-10: qty 2

## 2013-10-10 MED ORDER — PHENYLEPHRINE HCL 10 MG/ML IJ SOLN
0.0000 ug/min | INTRAVENOUS | Status: DC
  Filled 2013-10-10 (×3): qty 2

## 2013-10-10 MED ORDER — GLYCOPYRROLATE 0.2 MG/ML IJ SOLN
INTRAMUSCULAR | Status: DC | PRN
  Administered 2013-10-10: 0.2 mg via INTRAVENOUS

## 2013-10-10 MED ORDER — HEMOSTATIC AGENTS (NO CHARGE) OPTIME
TOPICAL | Status: DC | PRN
  Administered 2013-10-10: 1 via TOPICAL

## 2013-10-10 MED ORDER — SODIUM CHLORIDE 0.9 % IV SOLN: 250.0000 mL | INTRAVENOUS | Status: DC

## 2013-10-10 MED ORDER — NITROGLYCERIN IN D5W 200-5 MCG/ML-% IV SOLN: 0.0000 ug/min | INTRAVENOUS | Status: DC

## 2013-10-10 MED ORDER — POTASSIUM CHLORIDE 10 MEQ/50ML IV SOLN
10.0000 meq | INTRAVENOUS | Status: AC
  Administered 2013-10-10 (×2): 10 meq via INTRAVENOUS

## 2013-10-10 MED ORDER — VANCOMYCIN HCL IN DEXTROSE 1-5 GM/200ML-% IV SOLN
1000.0000 mg | Freq: Once | INTRAVENOUS | Status: AC
  Administered 2013-10-10: 1000 mg via INTRAVENOUS
  Filled 2013-10-10: qty 200

## 2013-10-10 MED ORDER — FAMOTIDINE IN NACL 20-0.9 MG/50ML-% IV SOLN
20.0000 mg | Freq: Two times a day (BID) | INTRAVENOUS | Status: DC
  Administered 2013-10-10: 20 mg via INTRAVENOUS

## 2013-10-10 MED ORDER — ROCURONIUM BROMIDE 100 MG/10ML IV SOLN
INTRAVENOUS | Status: DC | PRN
  Administered 2013-10-10: 30 mg via INTRAVENOUS
  Administered 2013-10-10: 20 mg via INTRAVENOUS
  Administered 2013-10-10: 50 mg via INTRAVENOUS

## 2013-10-10 MED ORDER — DOCUSATE SODIUM 100 MG PO CAPS
200.0000 mg | ORAL_CAPSULE | Freq: Every day | ORAL | Status: DC
  Administered 2013-10-11 – 2013-10-13 (×3): 200 mg via ORAL
  Filled 2013-10-10 (×4): qty 2

## 2013-10-10 MED ORDER — ALBUMIN HUMAN 5 % IV SOLN
250.0000 mL | INTRAVENOUS | Status: AC | PRN
  Administered 2013-10-10: 250 mL via INTRAVENOUS

## 2013-10-10 MED ORDER — THROMBIN 20000 UNITS EX SOLR
CUTANEOUS | Status: AC
  Filled 2013-10-10: qty 20000

## 2013-10-10 MED ORDER — SODIUM CHLORIDE 0.45 % IV SOLN
INTRAVENOUS | Status: DC
Start: 2013-10-10 — End: 2013-10-13
  Administered 2013-10-10: 20 mL/h via INTRAVENOUS

## 2013-10-10 MED ORDER — MORPHINE SULFATE 2 MG/ML IJ SOLN
1.0000 mg | INTRAMUSCULAR | Status: AC | PRN
  Administered 2013-10-10 (×3): 4 mg via INTRAVENOUS
  Administered 2013-10-10: 2 mg via INTRAVENOUS
  Filled 2013-10-10: qty 2

## 2013-10-10 MED ORDER — PROPOFOL 10 MG/ML IV BOLUS
INTRAVENOUS | Status: DC | PRN
  Administered 2013-10-10: 50 mg via INTRAVENOUS

## 2013-10-10 MED ORDER — PROTAMINE SULFATE 10 MG/ML IV SOLN
INTRAVENOUS | Status: DC | PRN
  Administered 2013-10-10: 25 mg via INTRAVENOUS

## 2013-10-10 MED ORDER — INSULIN REGULAR HUMAN 100 UNIT/ML IJ SOLN
INTRAMUSCULAR | Status: DC
  Filled 2013-10-10: qty 1

## 2013-10-10 MED ORDER — HEPARIN SODIUM (PORCINE) 1000 UNIT/ML IJ SOLN
INTRAMUSCULAR | Status: DC | PRN
  Administered 2013-10-10: 25 mL via INTRAVENOUS

## 2013-10-10 MED ORDER — PANTOPRAZOLE SODIUM 40 MG PO TBEC
40.0000 mg | DELAYED_RELEASE_TABLET | Freq: Every day | ORAL | Status: DC
  Administered 2013-10-12 – 2013-10-13 (×2): 40 mg via ORAL
  Filled 2013-10-10 (×2): qty 1

## 2013-10-10 MED ORDER — SODIUM CHLORIDE 0.9 % IJ SOLN: 3.0000 mL | INTRAMUSCULAR | Status: DC | PRN

## 2013-10-10 MED ORDER — BISACODYL 10 MG RE SUPP: 10.0000 mg | Freq: Every day | RECTAL | Status: DC

## 2013-10-10 MED ORDER — OXYCODONE HCL 5 MG PO TABS
5.0000 mg | ORAL_TABLET | ORAL | Status: DC | PRN
  Administered 2013-10-11 – 2013-10-12 (×3): 10 mg via ORAL
  Filled 2013-10-10 (×3): qty 2

## 2013-10-10 MED ORDER — ONDANSETRON HCL 4 MG/2ML IJ SOLN
4.0000 mg | Freq: Four times a day (QID) | INTRAMUSCULAR | Status: DC | PRN
  Administered 2013-10-10 – 2013-10-11 (×2): 4 mg via INTRAVENOUS
  Filled 2013-10-10 (×2): qty 2

## 2013-10-10 MED ORDER — LACTATED RINGERS IV SOLN
INTRAVENOUS | Status: DC | PRN
  Administered 2013-10-10: 07:00:00 via INTRAVENOUS

## 2013-10-10 MED ORDER — METOPROLOL TARTRATE 1 MG/ML IV SOLN: 2.5000 mg | INTRAVENOUS | Status: DC | PRN

## 2013-10-10 MED ORDER — MAGNESIUM SULFATE 40 MG/ML IJ SOLN
4.0000 g | Freq: Once | INTRAMUSCULAR | Status: AC
  Administered 2013-10-10: 4 g via INTRAVENOUS
  Filled 2013-10-10: qty 100

## 2013-10-10 MED ORDER — DEXTROSE 5 % IV SOLN
1.5000 g | Freq: Two times a day (BID) | INTRAVENOUS | Status: AC
  Administered 2013-10-10 – 2013-10-12 (×4): 1.5 g via INTRAVENOUS
  Filled 2013-10-10 (×4): qty 1.5

## 2013-10-10 MED ORDER — LACTATED RINGERS IV SOLN: INTRAVENOUS | Status: DC

## 2013-10-10 MED ORDER — LACTATED RINGERS IV SOLN
INTRAVENOUS | Status: DC | PRN
  Administered 2013-10-10 (×2): via INTRAVENOUS

## 2013-10-10 MED ORDER — ACETAMINOPHEN 500 MG PO TABS
1000.0000 mg | ORAL_TABLET | Freq: Four times a day (QID) | ORAL | Status: DC
  Administered 2013-10-11 – 2013-10-13 (×10): 1000 mg via ORAL
  Filled 2013-10-10 (×13): qty 2

## 2013-10-10 MED ORDER — ASPIRIN EC 325 MG PO TBEC
325.0000 mg | DELAYED_RELEASE_TABLET | Freq: Every day | ORAL | Status: DC
  Administered 2013-10-11 – 2013-10-13 (×3): 325 mg via ORAL
  Filled 2013-10-10 (×3): qty 1

## 2013-10-10 MED ORDER — SODIUM CHLORIDE 0.9 % IV SOLN
10.0000 g | INTRAVENOUS | Status: DC | PRN
  Administered 2013-10-10: 5 g/h via INTRAVENOUS

## 2013-10-10 MED ORDER — DEXMEDETOMIDINE HCL IN NACL 200 MCG/50ML IV SOLN
0.1000 ug/kg/h | INTRAVENOUS | Status: DC
  Administered 2013-10-10: 0.5 ug/kg/h via INTRAVENOUS

## 2013-10-10 MED ORDER — BISACODYL 5 MG PO TBEC
10.0000 mg | DELAYED_RELEASE_TABLET | Freq: Every day | ORAL | Status: DC
  Administered 2013-10-11 – 2013-10-13 (×3): 10 mg via ORAL
  Filled 2013-10-10 (×3): qty 2

## 2013-10-10 SURGICAL SUPPLY — 113 items
ADAPTER CARDIO PERF ANTE/RETRO (ADAPTER) ×3 IMPLANT
ATTRACTOMAT 16X20 MAGNETIC DRP (DRAPES) ×3 IMPLANT
BAG DECANTER FOR FLEXI CONT (MISCELLANEOUS) ×3 IMPLANT
BANDAGE ELASTIC 4 VELCRO ST LF (GAUZE/BANDAGES/DRESSINGS) ×3 IMPLANT
BANDAGE ELASTIC 6 VELCRO ST LF (GAUZE/BANDAGES/DRESSINGS) ×3 IMPLANT
BANDAGE GAUZE ELAST BULKY 4 IN (GAUZE/BANDAGES/DRESSINGS) ×3 IMPLANT
BASKET HEART (ORDER IN 25'S) (MISCELLANEOUS) ×1
BASKET HEART (ORDER IN 25S) (MISCELLANEOUS) ×2 IMPLANT
BLADE STERNUM SYSTEM 6 (BLADE) ×3 IMPLANT
BLADE SURG 15 STRL LF DISP TIS (BLADE) ×2 IMPLANT
BLADE SURG 15 STRL SS (BLADE) ×1
CANISTER SUCTION 2500CC (MISCELLANEOUS) ×3 IMPLANT
CANNULA GUNDRY RCSP 15FR (MISCELLANEOUS) ×3 IMPLANT
CANNULA VENOUS LOW PROF 34X46 (CANNULA) IMPLANT
CARDIAC SUCTION (MISCELLANEOUS) ×3 IMPLANT
CATH ROBINSON RED A/P 18FR (CATHETERS) ×9 IMPLANT
CATH THORACIC 28FR (CATHETERS) ×3 IMPLANT
CATH THORACIC 28FR RT ANG (CATHETERS) IMPLANT
CATH THORACIC 36FR (CATHETERS) ×3 IMPLANT
CATH THORACIC 36FR RT ANG (CATHETERS) ×3 IMPLANT
CLIP TI MEDIUM 24 (CLIP) IMPLANT
CLIP TI WIDE RED SMALL 24 (CLIP) ×3 IMPLANT
CONT SPEC STER OR (MISCELLANEOUS) ×3 IMPLANT
COUNTER NEEDLE 20 DBL MAG RED (NEEDLE) ×3 IMPLANT
COVER SURGICAL LIGHT HANDLE (MISCELLANEOUS) ×3 IMPLANT
CRADLE DONUT ADULT HEAD (MISCELLANEOUS) ×3 IMPLANT
DRAPE CARDIOVASCULAR INCISE (DRAPES) ×1
DRAPE SLUSH/WARMER DISC (DRAPES) ×3 IMPLANT
DRAPE SRG 135X102X78XABS (DRAPES) ×2 IMPLANT
DRSG COVADERM 4X14 (GAUZE/BANDAGES/DRESSINGS) ×3 IMPLANT
ELECT CAUTERY BLADE 6.4 (BLADE) ×3 IMPLANT
ELECT REM PT RETURN 9FT ADLT (ELECTROSURGICAL) ×6
ELECTRODE REM PT RTRN 9FT ADLT (ELECTROSURGICAL) ×4 IMPLANT
GLOVE BIO SURGEON STRL SZ 6 (GLOVE) ×6 IMPLANT
GLOVE BIO SURGEON STRL SZ 6.5 (GLOVE) ×12 IMPLANT
GLOVE BIO SURGEON STRL SZ7 (GLOVE) ×6 IMPLANT
GLOVE BIO SURGEON STRL SZ7.5 (GLOVE) IMPLANT
GLOVE BIOGEL PI IND STRL 6 (GLOVE) IMPLANT
GLOVE BIOGEL PI IND STRL 6.5 (GLOVE) ×8 IMPLANT
GLOVE BIOGEL PI IND STRL 7.0 (GLOVE) ×8 IMPLANT
GLOVE BIOGEL PI INDICATOR 6 (GLOVE)
GLOVE BIOGEL PI INDICATOR 6.5 (GLOVE) ×4
GLOVE BIOGEL PI INDICATOR 7.0 (GLOVE) ×4
GLOVE EUDERMIC 7 POWDERFREE (GLOVE) ×6 IMPLANT
GLOVE ORTHO TXT STRL SZ7.5 (GLOVE) IMPLANT
GOWN PREVENTION PLUS XLARGE (GOWN DISPOSABLE) ×15 IMPLANT
GOWN STRL NON-REIN LRG LVL3 (GOWN DISPOSABLE) ×6 IMPLANT
HEART VENT LT CURVED (MISCELLANEOUS) ×3 IMPLANT
HEMOSTAT POWDER SURGIFOAM 1G (HEMOSTASIS) ×9 IMPLANT
HEMOSTAT SURGICEL 2X14 (HEMOSTASIS) ×3 IMPLANT
INSERT FOGARTY 61MM (MISCELLANEOUS) ×6 IMPLANT
INSERT FOGARTY XLG (MISCELLANEOUS) IMPLANT
KIT BASIN OR (CUSTOM PROCEDURE TRAY) ×3 IMPLANT
KIT CATH CPB BARTLE (MISCELLANEOUS) ×3 IMPLANT
KIT ROOM TURNOVER OR (KITS) ×3 IMPLANT
KIT SUCTION CATH 14FR (SUCTIONS) ×3 IMPLANT
KIT VASOVIEW W/TROCAR VH 2000 (KITS) ×3 IMPLANT
LINE VENT (MISCELLANEOUS) ×3 IMPLANT
NS IRRIG 1000ML POUR BTL (IV SOLUTION) ×15 IMPLANT
PACK OPEN HEART (CUSTOM PROCEDURE TRAY) ×3 IMPLANT
PAD ARMBOARD 7.5X6 YLW CONV (MISCELLANEOUS) ×6 IMPLANT
PAD ELECT DEFIB RADIOL ZOLL (MISCELLANEOUS) ×3 IMPLANT
PENCIL BUTTON HOLSTER BLD 10FT (ELECTRODE) ×3 IMPLANT
PUNCH AORTIC ROTATE 4.0MM (MISCELLANEOUS) IMPLANT
PUNCH AORTIC ROTATE 4.5MM 8IN (MISCELLANEOUS) ×3 IMPLANT
PUNCH AORTIC ROTATE 5MM 8IN (MISCELLANEOUS) IMPLANT
SET CARDIOPLEGIA MPS 5001102 (MISCELLANEOUS) ×3 IMPLANT
SOLUTION ANTI FOG 6CC (MISCELLANEOUS) ×3 IMPLANT
SPONGE GAUZE 4X4 12PLY (GAUZE/BANDAGES/DRESSINGS) ×6 IMPLANT
SPONGE INTESTINAL PEANUT (DISPOSABLE) IMPLANT
SPONGE LAP 18X18 X RAY DECT (DISPOSABLE) IMPLANT
SPONGE LAP 4X18 X RAY DECT (DISPOSABLE) ×3 IMPLANT
SUT BONE WAX W31G (SUTURE) ×3 IMPLANT
SUT ETHIBON 2 0 V 52N 30 (SUTURE) ×6 IMPLANT
SUT ETHIBOND 2 0 SH (SUTURE) ×1
SUT ETHIBOND 2 0 SH 36X2 (SUTURE) ×2 IMPLANT
SUT MNCRL AB 4-0 PS2 18 (SUTURE) ×3 IMPLANT
SUT PROLENE 3 0 SH 1 (SUTURE) IMPLANT
SUT PROLENE 3 0 SH DA (SUTURE) IMPLANT
SUT PROLENE 3 0 SH1 36 (SUTURE) ×3 IMPLANT
SUT PROLENE 4 0 RB 1 (SUTURE) ×3
SUT PROLENE 4 0 SH DA (SUTURE) IMPLANT
SUT PROLENE 4-0 RB1 .5 CRCL 36 (SUTURE) ×6 IMPLANT
SUT PROLENE 5 0 C 1 36 (SUTURE) IMPLANT
SUT PROLENE 6 0 C 1 30 (SUTURE) ×6 IMPLANT
SUT PROLENE 7 0 BV 1 (SUTURE) IMPLANT
SUT PROLENE 7 0 BV1 MDA (SUTURE) ×6 IMPLANT
SUT PROLENE 8 0 BV175 6 (SUTURE) IMPLANT
SUT SILK  1 MH (SUTURE)
SUT SILK 1 MH (SUTURE) IMPLANT
SUT SILK 2 0 SH CR/8 (SUTURE) ×3 IMPLANT
SUT STEEL 6MS V (SUTURE) IMPLANT
SUT STEEL STERNAL CCS#1 18IN (SUTURE) IMPLANT
SUT STEEL SZ 6 DBL 3X14 BALL (SUTURE) IMPLANT
SUT VIC AB 1 CTX 36 (SUTURE) ×3
SUT VIC AB 1 CTX36XBRD ANBCTR (SUTURE) ×6 IMPLANT
SUT VIC AB 2-0 CT1 27 (SUTURE) ×1
SUT VIC AB 2-0 CT1 TAPERPNT 27 (SUTURE) ×2 IMPLANT
SUT VIC AB 2-0 CTX 27 (SUTURE) IMPLANT
SUT VIC AB 3-0 SH 27 (SUTURE)
SUT VIC AB 3-0 SH 27X BRD (SUTURE) IMPLANT
SUT VIC AB 3-0 X1 27 (SUTURE) ×3 IMPLANT
SUT VICRYL 4-0 PS2 18IN ABS (SUTURE) IMPLANT
SUTURE E-PAK OPEN HEART (SUTURE) ×3 IMPLANT
SYSTEM SAHARA CHEST DRAIN ATS (WOUND CARE) ×3 IMPLANT
TAPE CLOTH SURG 4X10 WHT LF (GAUZE/BANDAGES/DRESSINGS) ×6 IMPLANT
TOWEL OR 17X24 6PK STRL BLUE (TOWEL DISPOSABLE) ×6 IMPLANT
TOWEL OR 17X26 10 PK STRL BLUE (TOWEL DISPOSABLE) ×6 IMPLANT
TRAY FOLEY IC TEMP SENS 14FR (CATHETERS) ×3 IMPLANT
TUBING INSUFFLATION 10FT LAP (TUBING) ×3 IMPLANT
UNDERPAD 30X30 INCONTINENT (UNDERPADS AND DIAPERS) ×3 IMPLANT
VALVE MAGNA EASE 21MM (Prosthesis & Implant Heart) ×3 IMPLANT
WATER STERILE IRR 1000ML POUR (IV SOLUTION) ×6 IMPLANT

## 2013-10-10 NOTE — Preoperative (Addendum)
Beta Blockers   Reason not to administer Beta Blockers:NA

## 2013-10-10 NOTE — Anesthesia Preprocedure Evaluation (Signed)
Anesthesia Evaluation  Patient identified by MRN, date of birth, ID band Patient awake    Reviewed: Allergy & Precautions, H&P , NPO status , Patient's Chart, lab work & pertinent test results, reviewed documented beta blocker date and time   Airway Mallampati: II TM Distance: >3 FB Neck ROM: Full    Dental  (+) Edentulous Upper   Pulmonary  breath sounds clear to auscultation        Cardiovascular hypertension, Rhythm:Regular Rate:Normal     Neuro/Psych    GI/Hepatic   Endo/Other    Renal/GU      Musculoskeletal   Abdominal Normal abdominal exam  (+)   Peds  Hematology   Anesthesia Other Findings   Reproductive/Obstetrics                           Anesthesia Physical Anesthesia Plan  ASA: III  Anesthesia Plan: General   Post-op Pain Management:    Induction: Intravenous  Airway Management Planned: Oral ETT  Additional Equipment: Arterial line, CVP, PA Cath, TEE, 3D TEE and Ultrasound Guidance Line Placement  Intra-op Plan:   Post-operative Plan: Post-operative intubation/ventilation  Informed Consent:   Dental advisory given  Plan Discussed with: CRNA, Anesthesiologist and Surgeon  Anesthesia Plan Comments:         Anesthesia Quick Evaluation

## 2013-10-10 NOTE — Transfer of Care (Signed)
Immediate Anesthesia Transfer of Care Note  Patient: Laurie Lambert  Procedure(s) Performed: Procedure(s): AORTIC VALVE REPLACEMENT (AVR) (N/A) CORONARY ARTERY BYPASS GRAFTING TIMES THREE USING LEFT INTERNAL MAMMARY ARTERY AND RIGHT LEG GREATER SAPHENOUS VEIN HARVESTED ENDOSCOPICALLY (N/A) INTRAOPERATIVE TRANSESOPHAGEAL ECHOCARDIOGRAM (N/A)  Patient Location: SICU  Anesthesia Type:General  Level of Consciousness: Patient remains intubated per anesthesia plan  Airway & Oxygen Therapy: Patient remains intubated per anesthesia plan  Post-op Assessment: Report given to PACU RN  Post vital signs: Reviewed and stable  Complications: No apparent anesthesia complications

## 2013-10-10 NOTE — Progress Notes (Signed)
Echocardiogram 2D Echocardiogram has been performed.  Joelene Millin 10/10/2013, 12:01 PM

## 2013-10-10 NOTE — Progress Notes (Signed)
Discussed with interpretor and patient and her husband the fact that her name band only has Laurie Lambert and not [employer].  They stated that yes Laurie Lambert is her name and she does not need the [place] on her name band.

## 2013-10-10 NOTE — Anesthesia Postprocedure Evaluation (Signed)
  Anesthesia Post-op Note  Patient: Laurie Lambert  Procedure(s) Performed: Procedure(s): AORTIC VALVE REPLACEMENT (AVR) (N/A) CORONARY ARTERY BYPASS GRAFTING TIMES THREE USING LEFT INTERNAL MAMMARY ARTERY AND RIGHT LEG GREATER SAPHENOUS VEIN HARVESTED ENDOSCOPICALLY (N/A) INTRAOPERATIVE TRANSESOPHAGEAL ECHOCARDIOGRAM (N/A)  Patient Location: SICU  Anesthesia Type:General  Level of Consciousness: sedated, unresponsive and Patient remains intubated per anesthesia plan  Airway and Oxygen Therapy: Patient remains intubated per anesthesia plan and Patient placed on Ventilator (see vital sign flow sheet for setting)  Post-op Pain: none  Post-op Assessment: Post-op Vital signs reviewed  Post-op Vital Signs: stable  Complications: No apparent anesthesia complications

## 2013-10-10 NOTE — Interval H&P Note (Signed)
History and Physical Interval Note:  10/10/2013 7:27 AM  Laurie Lambert  has presented today for surgery, with the diagnosis of CAD SEVERE AS  The various methods of treatment have been discussed with the patient and family. After consideration of risks, benefits and other options for treatment, the patient has consented to  Procedure(s): AORTIC VALVE REPLACEMENT (AVR) (N/A) CORONARY ARTERY BYPASS GRAFTING (CABG) (N/A) INTRAOPERATIVE TRANSESOPHAGEAL ECHOCARDIOGRAM (N/A) as a surgical intervention .  The patient's history has been reviewed, patient examined, no change in status, stable for surgery.  I have reviewed the patient's chart and labs.  Questions were answered to the patient's satisfaction.     Gaye Pollack

## 2013-10-10 NOTE — Op Note (Signed)
      [address]       [place] [address]             [phone]     10/10/2013  12:16 PM  PATIENT:  Laurie Lambert  58 y.o. female  PRE-OPERATIVE DIAGNOSIS:  Severe Aortic stenosis, Coronary Artery Disease  POST-OPERATIVE DIAGNOSIS:  Severe Aortic stenosis, Coronary Artery Disease  PROCEDURE:  Procedure(s): AORTIC VALVE REPLACEMENT (AVR)# 21 MAGNAEASE BIOPROSTHETIC CORONARY ARTERY BYPASS GRAFTING X3 LIMA-LAD; SEQ SVG-AM-RCA EVH RIGHT THIGH   SURGEON:  Surgeon(s): Gaye Pollack, MD  PHYSICIAN ASSISTANT: WAYNE GOLD PA-C  ANESTHESIA:   general  PATIENT CONDITION:  ICU - intubated and hemodynamically stable.  PRE-OPERATIVE WEIGHT: 36RW  COMPLICATIONS: NO KNOWN

## 2013-10-10 NOTE — Procedures (Signed)
Extubation Procedure Note  Patient Details:   Name: Laurie Lambert DOB: Mar 16, 1956 MRN: [id number]   Airway Documentation:     Evaluation  O2 sats: stable throughout Complications: No apparent complications Patient did tolerate procedure well. Bilateral Breath Sounds: Clear   Yes  Cordella Register 10/10/2013, 6:37 PM

## 2013-10-10 NOTE — Anesthesia Procedure Notes (Addendum)
Date/Time: 10/10/2013 9:04 AM Performed by: Clearnce Sorrel    Procedure Name: Intubation Date/Time: 10/10/2013 7:42 AM Performed by: Clearnce Sorrel Pre-anesthesia Checklist: Patient identified, Emergency Drugs available, Suction available, Patient being monitored and Timeout performed Patient Re-evaluated:Patient Re-evaluated prior to inductionOxygen Delivery Method: Circle system utilized Preoxygenation: Pre-oxygenation with 100% oxygen Intubation Type: IV induction Ventilation: Mask ventilation without difficulty Laryngoscope Size: Mac and 3 Grade View: Grade I Tube type: Oral Tube size: 8.0 mm Number of attempts: 1 Airway Equipment and Method: Stylet Placement Confirmation: ETT inserted through vocal cords under direct vision,  positive ETCO2 and breath sounds checked- equal and bilateral Secured at: 22 cm Tube secured with: Tape Dental Injury: Teeth and Oropharynx as per pre-operative assessment

## 2013-10-10 NOTE — Progress Notes (Signed)
Preop check list completed with the help of [employer] rep number [id number].

## 2013-10-10 NOTE — H&P (Signed)
[address]       [place] [address]             [phone]      Cardiothoracic Surgery History and Physical   PCP is York Ram, MD Referring Provider is Burnell Blanks*    Chief Complaint   Patient presents with   .  Coronary Artery Disease       Surgical eval for possible CABGs and AVR, Cardiac Cath and TEE on 08/12/13   .  Aortic Stenosis        HPI:   The patient is a 58 year old [place] woman who speaks no [place] and is here with an interpreter today. She was initially seen by Dr. Angelena Form on 07/06/2013 for complaints of some chest pain radiating to the back and some palpitations with a feeling of her heart beating in her left ear. She was noted to have a heart murmur and leg edema by her primary physician. An echo showed severe AS with a mean gradient of 54 mm Hg and a peak gradient of 107 mm Hg with normal LV function and mild MR. A stress myoview showed possible inferior ischemia. A TEE confirmed severe AS with a trileaflet valve and an AVA of 0.7 cm2 with moderate AI. Cardiac cath showed severe AS with a mean gradient of 46 and a peak to peak gradient of 56. Coronary angiography showed the LAD to have a 50% mid vessel stenosis. The RCA is a large dominant vessel with a long 99% stenosis in the mid vessel followed by 99% stenosis in the distal vessel.    Past Medical History   Diagnosis  Date   .  Hypertension     .  Edema leg     .  Heart murmur     .  Hyperlipemia           Past Surgical History   Procedure  Laterality  Date   .  Breast cyst removal       .  Ankle surgery  Right         25 years ago   .  Tee without cardioversion  N/A  08/12/2013       Procedure: TRANSESOPHAGEAL ECHOCARDIOGRAM (TEE);  Surgeon: Dorothy Spark, MD;  Location[facility] END[facility]COPY;  Service: Cardiovascular;  Laterality: N/A;  Spanish Interpreter needed         Family History   Problem  Relation  Age of Onset   .  CAD  Neg Hx           Social History History   Substance Use Topics   .  Smoking status:  Never Smoker    .  Smokeless tobacco:  Never Used   .  Alcohol Use:  No    Lives with her husband in [place]. 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 Travels to other cities during the week to work and comes home on the weekend.    Current Outpatient Prescriptions   Medication  Sig  Dispense  Refill   .  lovastatin (MEVACOR) 20 MG tablet  Take 20 mg by mouth at bedtime.         .  triamterene-hydrochlorothiazide (DYAZIDE) 37.5-25 MG per capsule  Take 1 capsule by mouth daily.              No current facility-administered medications for this visit.        No Known Allergies  Review of Systems  Constitutional: Negative for fever, chills, activity change, fatigue and unexpected weight change.  HENT: Negative.   Eyes: Negative.   Respiratory: Positive for shortness of breath.   Cardiovascular: Positive for chest pain, palpitations and leg swelling.        No orthopnea or PND  Gastrointestinal: Negative.   Endocrine: Negative.   Genitourinary: Negative.   Musculoskeletal: Negative.   Skin: Negative.   Allergic/Immunologic: Negative.   Neurological: Negative.  Negative for dizziness, syncope and light-headedness.  Hematological: Negative.   Psychiatric/Behavioral: Negative.       BP 157/69  Pulse 62  Resp 20  Ht 5\' 2"  (1.575 m)  Wt 148 lb (67.132 kg)  BMI 27.06 kg/m2  SpO2 98% Physical Exam  Constitutional: She is oriented to person, place, and time. She appears well-developed and well-nourished. No distress.  HENT:   Head: Atraumatic.   Mouth/Throat: Oropharynx is clear and moist.  Eyes: EOM are normal. Pupils are equal, round, and reactive to light.  Neck: Normal range of motion. Neck supple. No JVD present. No thyromegaly present.  Transmitted murmur to both sides of the neck.  Cardiovascular: Normal rate, regular rhythm and intact distal pulses.    Murmur heard. Harsh 3/6  crescendo/decrescendo murmur loudest along RSB.  Pulmonary/Chest: Effort normal and breath sounds normal. No respiratory distress. She has no wheezes. She has no rales.  Abdominal: Soft. Bowel sounds are normal. She exhibits no distension and no mass. There is no tenderness.  Musculoskeletal: She exhibits no edema.  Lymphadenopathy:    She has no cervical adenopathy.  Neurological: She is alert and oriented to person, place, and time. She has normal strength. No cranial nerve deficit or sensory deficit.  Skin: Skin is warm and dry.  Psychiatric: She has a normal mood and affect.        Diagnostic Tests:    [facility] [address], [place] [address] [phone]  ------------------------------------------------------------ Transthoracic Echocardiography  Patient: Reca, Vanloan MR #: [id number] Study Date: 07/25/2013 Gender: F Age: 27 Height: 160cm Weight: 69.4kg BSA: 1.60m^2 Pt. Status: Room:  PERFORMING [facility] SONOGRAPHER Barbra Sarks, RDCS ORDERING Angelena Form, Christopher REFERRING Harvey Cedars, Christopher ATTENDING Ena Dawley, M.D. cc:  ------------------------------------------------------------ LV EF: 55% - 60%  ------------------------------------------------------------ Indications: Chest pain 786.51. Murmur 785.2.  ------------------------------------------------------------ History: PMH: Acquired from the patient and from the patient's chart. Chest pain. Murmur and bilateral lower extremity edema. Risk factors: Hypertension.  ------------------------------------------------------------ Study Conclusions  - Left ventricle: The cavity size was normal. Wall thickness was increased in a pattern of mild LVH. Systolic function was normal. The estimated ejection fraction was in the range of 55% to 60%. - Aortic valve: There was severe stenosis. Mild regurgitation. - Mitral valve: Mild regurgitation. - Left atrium: The  atrium was mildly dilated. - Atrial septum: No defect or patent foramen ovale was identified. - Pulmonary arteries: PA peak pressure: 77mm Hg (S). Transthoracic echocardiography. M-mode, complete 2D, spectral Doppler, and color Doppler. Height: Height: 160cm. Height: 63in. Weight: Weight: 69.4kg. Weight: 152.7lb. Body mass index: BMI: 27.1kg/m^2. Body surface area: BSA: 1.61m^2. Blood pressure: 150/70. Patient status: Outpatient. Location: [facility]  ------------------------------------------------------------  ------------------------------------------------------------ Left ventricle: The cavity size was normal. Wall thickness was increased in a pattern of mild LVH. Systolic function was normal. The estimated ejection fraction was in the range of 55% to 60%.  ------------------------------------------------------------ Aortic valve: Severely calcified leaflets. Doppler: There was severe stenosis. Mild regurgitation. VTI ratio of LVOT  to aortic valve: 0.18. Valve area: 0.7cm^2(VTI). Indexed valve area: 0.41cm^2/m^2 (VTI). Peak velocity ratio of LVOT to aortic valve: 0.17. Valve area: 0.66cm^2 (Vmax). Indexed valve area: 0.38cm^2/m^2 (Vmax). Mean gradient: 49mm Hg (S). Peak gradient: 145mm Hg (S).  ------------------------------------------------------------ Mitral valve: Mildly thickened leaflets . Doppler: Mild regurgitation. Peak gradient: 41mm Hg (D).  ------------------------------------------------------------ Left atrium: The atrium was mildly dilated.  ------------------------------------------------------------ Atrial septum: No defect or patent foramen ovale was identified.  ------------------------------------------------------------ Right ventricle: The cavity size was normal. Wall thickness was normal. Systolic function was normal.  ------------------------------------------------------------ Pulmonic valve: Structurally normal valve. Cusp separation  was normal. Doppler: Transvalvular velocity was within the normal range. Mild regurgitation.  ------------------------------------------------------------ Tricuspid valve: Structurally normal valve. Leaflet separation was normal. Doppler: Transvalvular velocity was within the normal range. Mild regurgitation.  ------------------------------------------------------------ Right atrium: The atrium was normal in size.  ------------------------------------------------------------ Pericardium: The pericardium was normal in appearance.  ------------------------------------------------------------  2D measurements Normal Doppler measurements Norma Left ventricle l LVID ED, 43.7 mm 43-52 Main pulmonary artery chord, Pressure, 37 mm Hg =30 PLAX S LVID ES, 23.8 mm 23-38 Left ventricle chord, Ea, lat 8.4 cm/s ----- PLAX ann, tiss 8 FS, chord, 46 % >29 DP PLAX E/Ea, lat 8.7 ----- LVPW, ED 11.5 mm ------ ann, tiss 4 IVS/LVPW 1.14 <1.3 DP ratio, ED Ea, med 5.4 cm/s ----- Ventricular septum ann, tiss 6 IVS, ED 13.1 mm ------ DP LVOT E/Ea, med 13. ----- Diam, S 22 mm ------ ann, tiss 57 Area 3.8 cm^2 ------ DP Diam 22 mm ------ LVOT Aorta Peak vel, 90. cm/s ----- Root diam, 32 mm ------ S 2 ED VTI, S 26 cm ----- Left atrium HR 57 bpm ----- AP dim 41 mm ------ Stroke vol 98. ml ----- AP dim 2.37 cm/m^2 <2.2 8 index Cardiac 5.6 L/min ----- output Cardiac 3.3 L/(min-m ----- index ^2) Stroke 57. ml/m^2 ----- index 1 Aortic valve Peak vel, 516 cm/s ----- S Mean vel, 334 cm/s ----- S VTI, S 141 cm ----- Mean 54 mm Hg ----- gradient, S Peak 107 mm Hg ----- gradient, S VTI ratio 0.1 ----- LVOT/AV 8 Area, VTI 0.7 cm^2 ----- Area index 0.4 cm^2/m^2 ----- (VTI) 1 Peak vel 0.1 ----- ratio, 7 LVOT/AV Area, Vmax 0.6 cm^2 ----- 6 Area index 0.3 cm^2/m^2 ----- (Vmax) 8 Regurg PHT 408 ms ----- Mitral valve Peak E vel 74. cm/s ----- 1 Peak A vel 91. cm/s ----- 3 Decelerati  384 ms 150-2 on time 30 Peak 2 mm Hg ----- gradient, D Peak E/A 0.8 ----- ratio Tricuspid valve Regurg 282 cm/s ----- peak vel Peak RV-RA 32 mm Hg ----- gradient, S Systemic veins Estimated 5 mm Hg ----- CVP Right ventricle Pressure, 37 mm Hg <30 S Sa vel, 12. cm/s ----- lat ann, 4 tiss DP  ------------------------------------------------------------ Prepared and Electronically Authenticated by  Jenkins Rouge 2014-10-20T17:44:47.713           ...................................................................................................................................Marland Kitchen   [facility]   [address] [place], [place] [address]   [phone]       Cardiology Nuclear Med Study   Shewanda Cerf is a 58 y.o. female MRN : [id number] DOB: 16-Jan-1956   Procedure Date: 07/25/2013   Nuclear Med Background   Indication for Stress Test: Evaluation for Ischemia and Abnormal EKG   History: No prior known history of CAD; 07-25-13 Echo: Severe AS   Cardiac Risk Factors: Hypertension and Lipids   Symptoms: Chest Pain (patient says she is  currently having an 8/10 stabbing chest pain from front to back. When questioned a second time she stated she did NOT currently have any pain) and DOE   Nuclear Pre-Procedure   Caffeine/Decaff Intake: None> 12 hrs   NPO After: 8:30pm    Lungs: clear   O2 Sat: 97% on room air.   IV 0.9% NS with Angio Cath: 22g    IV Site: R Antecubital x 1, tolerated well   IV Started by: Irven Baltimore, RN    Chest Size (in): 34   Cup Size: C    Height: 5\' 3"  (1.6 m)   Weight: 151 lb (68.493 kg)    BMI: Body mass index is 26.76 kg/(m^2).   Tech Comments: N/A     Nuclear Med Study   1 or 2 day study: 1 day   Stress Test Type: Lexiscan    Reading MD: Ena Dawley, MD   Order Authorizing Provider: Lauree Chandler, MD    Resting Radionuclide: Technetium 97m Sestamibi   Resting Radionuclide Dose: 10.7 mCi    Stress  Radionuclide: Technetium 61m Sestamibi   Stress Radionuclide Dose: 33.0 mCi     Stress Protocol   Rest HR: 67   Stress HR: 100    Rest BP: 139/72   Stress BP: 140/61    Exercise Time (min): n/a   METS: n/a     Predicted Max HR: 165 bpm   % Max HR: 60.61 bpm   Rate Pressure Product: 14000   Dose of Adenosine (mg): n/a   Dose of Lexiscan: 0.4 mg    Dose of Atropine (mg): n/a   Dose of Dobutamine: n/a mcg/kg/min (at max HR)    Stress Test Technologist: Glade Lloyd, BS-ES   Nuclear Technologist: Charlton Amor, CNMT     Rest Procedure: Myocardial perfusion imaging was performed at rest 45 minutes following the intravenous administration of Technetium 51m Sestamibi.   Rest ECG: NSR - Normal EKG   Stress Procedure: The patient received IV Lexiscan 0.4 mg over 15-seconds. Technetium 9m Sestamibi injected at 30-seconds. Quantitative spect images were obtained after a 45 minute delay.Patient felt like her heart was racing with Lexiscan that resolved in recovery.   Stress ECG: No significant change from baseline ECG   QPS   Raw Data Images: Normal; no motion artifact; normal heart/lung ratio.   Stress Images: There is decreased perfusion in the mid and basal inferolateral and basal inferior and inferoseptal walls.   Rest Images: There is a perfusion defect in the basal inferolateral wall.   Subtraction (SDS): There is a medium size moderate severity reversible perfusion defect in the mid and basal inferolaterla, basal inferior and inferoseptal walls.   Transient Ischemic Dilatation (Normal <1.22): 1.10   Lung/Heart Ratio (Normal <0.45): 0.31   Quantitative Gated Spect Images   QGS EDV: 96 ml   QGS ESV: 42 ml   Impression   Exercise Capacity: Lexiscan with no exercise.   BP Response: Normal blood pressure response.   Clinical Symptoms: No significant symptoms noted.   ECG Impression: No significant ST segment change suggestive of ischemia.   Comparison with Prior Nuclear Study: No images to  compare   Overall Impression: Intermediate risk stress nuclear study with is a medium size moderate severity reversible perfusion defect in the RCA territory.   LV Ejection Fraction: 56%. LV Wall Motion: NL LV Function; there is hypokinesis of the basal inferior and basal and inferolateral inferolateral walls.   Ena Dawley, H   07/25/2013      ....................................................................................................................................      *  [facility]* [place], [place] [address] [phone]  ------------------------------------------------------------ Transesophageal Echocardiography  Patient: Hubert, Derstine MR #: [id number] Study Date: 08/12/2013 Gender: F Age: 51 Height: 160cm Weight: 67.1kg BSA: 1.22m^2 Pt. Status: Room: [facility]  SONOGRAPHER Mauricio Po, RDCS, CCT Willia Craze, Christopher ADMITTING Ena Dawley, M.D. ATTENDING Ena Dawley, M.D. PERFORMING Ena Dawley, M.D. cc:  ------------------------------------------------------------  ------------------------------------------------------------ Indications: Aortic stenosis 424.1.  ------------------------------------------------------------ Study Conclusions  - Left ventricle: The cavity size was normal. Wall thickness was normal. Systolic function was normal. Wall motion was normal; there were no regional wall motion abnormalities. - Aortic valve: Valve area: 0.79cm^2(VTI). Valve area: 0.74cm^2 (Vmax). - Mitral valve: No evidence of vegetation. Mild regurgitation. - Left atrium: The atrium was dilated. No evidence of thrombus in the atrial cavity or appendage. - Right atrium: No evidence of thrombus in the atrial cavity or appendage. - Tricuspid valve: No evidence of vegetation. - Pulmonic valve: No evidence of vegetation. Transesophageal echocardiography. 2D and color Doppler. Height:  Height: 160cm. Height: 63in. Weight: Weight: 67.1kg. Weight: 147.7lb. Body mass index: BMI: 26.2kg/m^2. Body surface area: BSA: 1.5m^2. Blood pressure: 178/51. Patient status: Outpatient. Location: Endoscopy.  ------------------------------------------------------------  ------------------------------------------------------------ Left ventricle: The cavity size was normal. Wall thickness was normal. Systolic function was normal. Wall motion was normal; there were no regional wall motion abnormalities.  ------------------------------------------------------------ Aortic valve: Aortic valve is trileaflet, severely calcified with restricted leaflet opening. Non-coronary cusp is fixed. AVA by planimetry 0.7 cm2, by continuity equation 0.7 cm2. Max gradient 61 mmHg, mean 38 mmHg, underestimated by suboptimal angle. Severe aortic stenosis, moderate aortic regurgitation. Doppler: VTI ratio of LVOT to aortic valve: 0.25. Valve area: 0.79cm^2(VTI). Indexed valve area: 0.46cm^2/m^2 (VTI). Peak velocity ratio of LVOT to aortic valve: 0.24. Valve area: 0.74cm^2 (Vmax). Indexed valve area: 0.44cm^2/m^2 (Vmax). Mean gradient: 66mm Hg (S). Peak gradient: 80mm Hg (S).  ------------------------------------------------------------ Aorta: Minimal atherosclerotic disease.  ------------------------------------------------------------ Mitral valve: Structurally normal valve. Leaflet separation was normal. No evidence of vegetation. Doppler: Mild regurgitation.  ------------------------------------------------------------ Left atrium: The atrium was dilated. No evidence of thrombus in the atrial cavity or appendage.  ------------------------------------------------------------ Pulmonic valve: Structurally normal valve. Cusp separation was normal. No evidence of vegetation.  ------------------------------------------------------------ Tricuspid valve: Structurally normal valve. Leaflet separation  was normal. No evidence of vegetation. Doppler: Trivial regurgitation.  ------------------------------------------------------------ Right atrium: The atrium was normal in size. No evidence of thrombus in the atrial cavity or appendage.  ------------------------------------------------------------ Pericardium: There was no pericardial effusion.  ------------------------------------------------------------ Post procedure conclusions Ascending Aorta:  - Minimal atherosclerotic disease.  ------------------------------------------------------------  2D measurements Normal Doppler measurements Norma LVOT l Diam, S 20 mm ------ LVOT Area 3.14 cm^2 ------ Peak vel, 88. cm/s ----- S 7 VTI, S 24 cm ----- Aortic valve Peak vel, 376 cm/s ----- S Mean vel, 275 cm/s ----- S VTI, S 95. cm ----- 4 Mean 35 mm Hg ----- gradient, S Peak 57 mm Hg ----- gradient, S VTI ratio 0.2 ----- LVOT/AV 5 Area, VTI 0.7 cm^2 ----- 9 Area 0.4 cm^2/m^2 ----- index 6 (VTI) Peak vel 0.2 ----- ratio, 4 LVOT/AV Area, 0.7 cm^2 ----- Vmax 4 Area 0.4 cm^2/m^2 ----- index 4 (Vmax) Regurg 344 ms ----- PHT  ------------------------------------------------------------ Prepared and Electronically Authenticated by  Ena Dawley, M.D. 2014-11-07T19:05:54.890           ....................................................................................................................................     Cardiac Catheterization Operative Report   Cythia Bachtel   [id number]   11/7/201411:56 AM   Pcp Not In System   Procedure  Performed:  Left Heart Catheterization Selective Coronary Angiography Right Heart Catheterization Left ventricular pressures Operator: Lauree Chandler, MD   Indication: 58 yo female with history of HTN, severe aortic stenosis, LE edema, chest pain and abnormal stress myoview here today for cardiac cath to define coronary anatomy and assess valve/pressures  before surgical referral for severe AS. TEE this am.   Procedure Details:   The risks, benefits, complications, treatment options, and expected outcomes were discussed with the patient. The patient and/or family concurred with the proposed plan, giving informed consent. The patient was brought to the cath lab after IV hydration was begun and oral premedication was given. The patient was further sedated with Versed and Fentanyl. The right gorin was prepped and draped in the usual manner. Using the modified Seldinger access technique, a 5 French sheath was placed in the right femoral artery. A 6 French sheath was inserted into the right femoral vein. A multi-purpose catheter was used to perform a right heart catheterization. Standard diagnostic catheters were used to perform selective coronary angiography. An AL-1 catheter was used with a soft straight wire to cross the aortic valve. There were no immediate complications. The patient was taken to the recovery area in stable condition.   Hemodynamic Findings:   Ao: 13859  LV: 197/9/22   RA: 9   RV: 36/7/11   PA: 34/11 (mean 22)   PCWP: 17   Fick Cardiac Output: 4.96 L/min   Fick Cardiac Index: 2.92 L/min/m2   Central Aortic Saturation: 94%   Pulmonary Artery Saturation: 68%   Aortic valve data: Peak to peak gradient 56 mm Hg   Mean gradient 46 mm Hg   AVA 0.89 cm2   AVA index 0.52   Angiographic Findings:   Left main: No obstructive disease.   Left Anterior Descending Artery: Large caliber vessel that courses to the apex. 50% mid stenosis. 2 small caliber diagonal branches.   Circumflex Artery: Large caliber vessel with termination into a large obtuse marginal branch. Mild plaque disease in the obtuse marginal branch.   Right Coronary Artery: Large dominant vessel with long 99% stenosis of the mid vessel followed by 99% stenosis in the distal vessel. The posterolateral artery and the PDA is small to moderate in caliber with mild diffuse plaque  disease. The distal vessel is seen to fill also from left to right collaterals.   Left Ventricular Angiogram: Deferred.   Impression:   1. Double vessel CAD   2. Severe aortic valve stenosis.   3. Recent chest pain, dyspnea, lower extremity edema.   Recommendations: TEE this am confirming severely calcified aortic valve. She is symptomatic from her valvular disease and her CAD. Will refer to CT surgery for evaluation for AVR and bypass. Pt is here with her husband and an interpretor and expresses an understanding of the plan.   Complications: None; patient tolerated the procedure well.        Impression:   She has severe aortic stenosis and 2-vessel coronary disease with moderate LAD stenosis and high grade RCA stenosis with inferior ischemia on myoview exam. She has mild symptoms although I think she may minimize them because she is used to working hard. I think she needs AVR and CABG to the LAD and RCA to prevent further ischemia, infarction, and congestive heart failure symptoms. I discussed the pros and cons of mechanical and tissue valves. 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 I would recommend a tissue valve which should give her good longevity with much less risk. She is in agreement with using a tissue valve. I discussed the operative procedure with the patient including alternatives, benefits and risks; including but not limited to bleeding, blood transfusion, infection, stroke, myocardial infarction, graft failure, heart block requiring a permanent pacemaker, organ dysfunction, and death.  [name] understands and agrees to proceed. We have scheduled surgery for Thursday 09/08/2013.   Plan:   AVR with a tissue valve and CABG on 09/08/2013.

## 2013-10-10 NOTE — Progress Notes (Signed)
TCTS BRIEF SICU PROGRESS NOTE  Day of Surgery  S/P Procedure(s) (LRB): AORTIC VALVE REPLACEMENT (AVR) (N/A) CORONARY ARTERY BYPASS GRAFTING TIMES THREE USING LEFT INTERNAL MAMMARY ARTERY AND RIGHT LEG GREATER SAPHENOUS VEIN HARVESTED ENDOSCOPICALLY (N/A) INTRAOPERATIVE TRANSESOPHAGEAL ECHOCARDIOGRAM (N/A)   Extubated uneventfully Neuro grossly intact NSR w/ stable hemodynamics on very low dose Neo drip Chest tube output low UOP excellent  Plan: Continue routine early postop  Laurie Lambert H 10/10/2013 9:11 PM

## 2013-10-10 NOTE — Op Note (Signed)
CARDIOVASCULAR SURGERY OPERATIVE NOTE  10/10/2013  Surgeon:  Gaye Pollack, MD  First Assistant: Jadene Pierini,  PA-C   Preoperative Diagnosis:  Severe multi-vessel coronary artery disease   Postoperative Diagnosis:  Same   Procedure:  1. Median Sternotomy 2. Extracorporeal circulation 3.   Coronary artery bypass grafting x 3   Left internal mammary graft to the LAD  Sequential SVG to AM and PDA    4.   Aortic valve replacement using a 21 mm Edwards pericardial valve 5.   Endoscopic harvesting of right greater saphenous vein   Anesthesia:  General Endotracheal   Clinical History/Surgical Indication:  The patient is a 58 year old [place] woman who speaks no [place] and is here with an interpreter today. She was initially seen by Dr. Angelena Form on 07/06/2013 for complaints of some chest pain radiating to the back and some palpitations with a feeling of her heart beating in her left ear. She was noted to have a heart murmur and leg edema by her primary physician. An echo showed severe AS with a mean gradient of 54 mm Hg and a peak gradient of 107 mm Hg with normal LV function and mild MR. A stress myoview showed possible inferior ischemia. A TEE confirmed severe AS with a trileaflet valve and an AVA of 0.7 cm2 with moderate AI. Cardiac cath showed severe AS with a mean gradient of 46 and a peak to peak gradient of 56. Coronary angiography showed the LAD to have a 50% mid vessel stenosis. The RCA is a large dominant vessel with a long 99% stenosis in the mid vessel followed by 99% stenosis in the distal vessel. She has severe aortic stenosis and 2-vessel coronary disease with moderate LAD stenosis and high grade RCA stenosis with inferior ischemia on myoview exam. She has mild symptoms although I think she may minimize them because she is used to working hard. I think she needs AVR and CABG to the  LAD and RCA to prevent further ischemia, infarction, and congestive heart failure symptoms. I discussed the pros and cons of mechanical and tissue valves. 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 I would recommend a tissue valve which should give her good longevity with much less risk. She is in agreement with using a tissue valve. I discussed the operative procedure with the patient including alternatives, benefits and risks; including but not limited to bleeding, blood transfusion, infection, stroke, myocardial infarction, graft failure, heart block requiring a permanent pacemaker, organ dysfunction, and death. [name] understands and agrees to proceed.    Preparation:  The patient was seen in the preoperative holding area and the correct patient, correct operation were confirmed with the patient after reviewing the medical record and catheterization. The consent was signed by me. Preoperative antibiotics were given. A pulmonary arterial line and radial arterial line were placed by the anesthesia team. The patient was taken back to the operating room and positioned supine on the operating room table. After being placed under general endotracheal anesthesia by the anesthesia team a foley catheter was placed. The neck, chest, abdomen, and both legs were prepped with betadine soap and solution and draped in the usual sterile manner. A surgical time-out was taken and the correct patient and operative procedure were confirmed with the nursing and anesthesia staff.   Cardiopulmonary Bypass:  A median sternotomy was performed. The pericardium was  opened in the midline. Right ventricular function appeared normal. The ascending aorta was of normal size and had no palpable plaque. There were no contraindications to aortic cannulation or cross-clamping. The patient was fully  systemically heparinized and the ACT was maintained > 400 sec. The proximal aortic arch was cannulated with a 20 F aortic cannula for arterial inflow. Venous cannulation was performed via the right atrial appendage using a two-staged venous cannula. An antegrade cardioplegia/vent cannula was inserted into the mid-ascending aorta. A retrograde cardioplegia cannula was placed in the coronary sinus via the right atrium. A left ventricular vent was placed via the right superior pulmonary vein.  Aortic occlusion was performed with a single cross-clamp. Systemic cooling to 32 degrees Centigrade and topical cooling of the heart with iced saline were used. Hyperkalemic antegrade cold blood cardioplegia was used to induce diastolic arrest and  Then cold blood retrograde cardioplegia was given at about 20 minute intervals throughout the period of arrest to maintain myocardial temperature at or below 10 degrees centigrade. A temperature probe was inserted into the interventricular septum and an insulating pad was placed in the pericardium.   Left internal mammary harvest:  The left side of the sternum was retracted using the Rultract retractor. The left internal mammary artery was harvested as a pedicle graft. All side branches were clipped. It was a medium-sized vessel of good quality with excellent blood flow. It was ligated distally and divided. It was sprayed with topical papaverine solution to prevent vasospasm.   Endoscopic vein harvest:  The right greater saphenous vein was harvested endoscopically through a 2 cm incision medial to the right knee. It was harvested from the thigh. It was a medium-sized vein of good quality. The side branches were all ligated with 4-0 silk ties.    Coronary arteries:  The coronary arteries were examined.   LAD:  No significant distal disease  LCX:  No significant distal disease  RCA:  Main portion of the vessel was heavily diseased. PDA with no significant disease.  AM small to moderate sized and graftable.   Grafts:  1. LIMA to the LAD: 2.0 mm. It was sewn end to side using 8-0 prolene continuous suture. 2. Seq SVG to AM:  1.6 mm. It was sewn side to side using 7-0 prolene continuous suture. 3. Seq SVG to PDA:  1.75 mm. It was sewn end to side using 7-0 prolene continuous suture.   The proximal vein graft anastomosis was performed to the mid-ascending aorta using continuous 6-0 prolene suture. A graft marker was placed around the proximal anastomosis.  Aortic Valve Replacement:  A transverse aortotomy was performed 1 cm above the take-off of the right coronary artery. The native valve was tricuspid with calcified leaflets and moderate annular calcification. The right and left leaflets were fused. The ostia of the coronary arteries were in normal position and were not obstructed. The native valve leaflets were excised and the annulus was decalcified with rongeurs. Care was taken to remove all particulate debris. The left ventricle was directly inspected for debris and then irrigated with ice saline solution. The annulus was sized and a size 21 mm [employer] Ease pericardial valve was chosen. The model number was [id number] and the serial number was [id number]. Her BSA was 1.6. While the valve was being prepared 2-0 Ethibond pledgeted horizontal mattress sutures were placed around the annulus with the pledgets in a sub-annular position. The sutures were placed through the sewing ring and the valve lowered into place.  The sutures were tied sequentially. The valve seated nicely and the coronary ostia were not obstructed. The prosthetic valve leaflets moved normally and there was no sub-valvular obstruction. The aortotomy was closed using 4-0 Prolene suture in 2 layers with felt strips to reinforce the closure.  Completion:  The patient was rewarmed to 37 degrees Centigrade. The clamp was removed from the LIMA pedicle and there was rapid warming of the septum and  return of ventricular fibrillation. The crossclamp was removed with a time of 134 minutes. There was spontaneous return of sinus rhythm. The distal and proximal anastomoses were checked for hemostasis. The position of the grafts was satisfactory. Two temporary epicardial pacing wires were placed on the right atrium and two on the right ventricle. The patient was weaned from CPB without difficulty on no inotropes. CPB time was 159 minutes. Cardiac output was 4 LPM. Heparin was fully reversed with protamine and the aortic and venous cannulas removed. Hemostasis was achieved. Mediastinal and left pleural drainage tubes were placed. The sternum was closed with double #6 stainless steel wires. The fascia was closed with continuous # 1 vicryl suture. The subcutaneous tissue was closed with 2-0 vicryl continuous suture. The skin was closed with 3-0 vicryl subcuticular suture. All sponge, needle, and instrument counts were reported correct at the end of the case. Dry sterile dressings were placed over the incisions and around the chest tubes which were connected to pleurevac suction. The patient was then transported to the surgical intensive care unit in critical but stable condition.

## 2013-10-11 ENCOUNTER — Inpatient Hospital Stay (HOSPITAL_COMMUNITY): Payer: BC Managed Care – PPO

## 2013-10-11 LAB — POCT I-STAT 4, (NA,K, GLUC, HGB,HCT)
Glucose, Bld: 81 mg/dL (ref 70–99)
HCT: 28 % — ABNORMAL LOW (ref 36.0–46.0)
Hemoglobin: 9.5 g/dL — ABNORMAL LOW (ref 12.0–15.0)
Potassium: 2.6 mEq/L — CL (ref 3.7–5.3)
SODIUM: 143 meq/L (ref 137–147)

## 2013-10-11 LAB — CREATININE, SERUM
Creatinine, Ser: 0.52 mg/dL (ref 0.50–1.10)
GFR calc Af Amer: >90 mL/min (ref >90)
GFR calc non Af Amer: >90 mL/min (ref >90)

## 2013-10-11 LAB — GLUCOSE, CAPILLARY
GLUCOSE-CAPILLARY: 100 mg/dL — AB (ref 70–99)
GLUCOSE-CAPILLARY: 113 mg/dL — AB (ref 70–99)
Glucose-Capillary: 105 mg/dL — ABNORMAL HIGH (ref 70–99)
Glucose-Capillary: 107 mg/dL — ABNORMAL HIGH (ref 70–99)
Glucose-Capillary: 108 mg/dL — ABNORMAL HIGH (ref 70–99)
Glucose-Capillary: 113 mg/dL — ABNORMAL HIGH (ref 70–99)

## 2013-10-11 LAB — BASIC METABOLIC PANEL
BUN: 12 mg/dL (ref 6–23)
CHLORIDE: 103 meq/L (ref 96–112)
CO2: 22 meq/L (ref 19–32)
CREATININE: 0.52 mg/dL (ref 0.50–1.10)
Calcium: 7.8 mg/dL — ABNORMAL LOW (ref 8.4–10.5)
GFR calc non Af Amer: >90 mL/min (ref >90)
Glucose, Bld: 114 mg/dL — ABNORMAL HIGH (ref 70–99)
Potassium: 3.3 mEq/L — ABNORMAL LOW (ref 3.7–5.3)
Sodium: 139 mEq/L (ref 137–147)

## 2013-10-11 LAB — CBC
HCT: 25.8 % — ABNORMAL LOW (ref 36.0–46.0)
HEMATOCRIT: 25.7 % — AB (ref 36.0–46.0)
Hemoglobin: 8.6 g/dL — ABNORMAL LOW (ref 12.0–15.0)
Hemoglobin: 8.6 g/dL — ABNORMAL LOW (ref 12.0–15.0)
MCH: 27.9 pg (ref 26.0–34.0)
MCH: 27.9 pg (ref 26.0–34.0)
MCHC: 33.3 g/dL (ref 30.0–36.0)
MCHC: 33.5 g/dL (ref 30.0–36.0)
MCV: 83.8 fL (ref 78.0–100.0)
MCV: 83.4 fL (ref 78.0–100.0)
PLATELETS: 117 10*3/uL — AB (ref 150–400)
Platelets: 117 10*3/uL — ABNORMAL LOW (ref 150–400)
RBC: 3.08 MIL/uL — ABNORMAL LOW (ref 3.87–5.11)
RBC: 3.08 MIL/uL — ABNORMAL LOW (ref 3.87–5.11)
RDW: 15.7 % — AB (ref 11.5–15.5)
RDW: 16.1 % — AB (ref 11.5–15.5)
WBC: 8.3 10*3/uL (ref 4.0–10.5)
WBC: 10.5 10*3/uL (ref 4.0–10.5)

## 2013-10-11 LAB — POCT I-STAT, CHEM 8
BUN: 16 mg/dL (ref 6–23)
CHLORIDE: 102 meq/L (ref 96–112)
Calcium, Ion: 1.25 mmol/L — ABNORMAL HIGH (ref 1.12–1.23)
Creatinine, Ser: 0.6 mg/dL (ref 0.50–1.10)
GLUCOSE: 113 mg/dL — AB (ref 70–99)
HEMATOCRIT: 24 % — AB (ref 36.0–46.0)
Hemoglobin: 8.2 g/dL — ABNORMAL LOW (ref 12.0–15.0)
Potassium: 3.8 mEq/L (ref 3.7–5.3)
Sodium: 139 mEq/L (ref 137–147)
TCO2: 22 mmol/L (ref 0–100)

## 2013-10-11 LAB — MAGNESIUM
MAGNESIUM: 2.4 mg/dL (ref 1.5–2.5)
Magnesium: 2.3 mg/dL (ref 1.5–2.5)

## 2013-10-11 LAB — POCT I-STAT 3, ART BLOOD GAS (G3+)
Acid-Base Excess: 1 mmol/L (ref 0.0–2.0)
BICARBONATE: 26.3 meq/L — AB (ref 20.0–24.0)
O2 Saturation: 97 %
PH ART: 7.415 (ref 7.350–7.450)
TCO2: 28 mmol/L (ref 0–100)
pCO2 arterial: 40.4 mmHg (ref 35.0–45.0)
pO2, Arterial: 87 mmHg (ref 80.0–100.0)

## 2013-10-11 MED ORDER — POTASSIUM CHLORIDE 10 MEQ/50ML IV SOLN
10.0000 meq | INTRAVENOUS | Status: AC
  Administered 2013-10-11 (×4): 10 meq via INTRAVENOUS
  Filled 2013-10-11 (×4): qty 50

## 2013-10-11 MED ORDER — POTASSIUM CHLORIDE 10 MEQ/50ML IV SOLN
10.0000 meq | INTRAVENOUS | Status: AC | PRN
  Administered 2013-10-11 (×3): 10 meq via INTRAVENOUS
  Filled 2013-10-11: qty 50

## 2013-10-11 MED ORDER — INSULIN ASPART 100 UNIT/ML ~~LOC~~ SOLN: 0.0000 [IU] | SUBCUTANEOUS | Status: DC

## 2013-10-11 MED ORDER — ENOXAPARIN SODIUM 40 MG/0.4ML ~~LOC~~ SOLN
40.0000 mg | Freq: Every day | SUBCUTANEOUS | Status: DC
  Administered 2013-10-11 – 2013-10-16 (×6): 40 mg via SUBCUTANEOUS
  Filled 2013-10-11 (×7): qty 0.4

## 2013-10-11 MED FILL — Mannitol IV Soln 20%: INTRAVENOUS | Qty: 500 | Status: AC

## 2013-10-11 MED FILL — Sodium Chloride IV Soln 0.9%: INTRAVENOUS | Qty: 2000 | Status: AC

## 2013-10-11 MED FILL — Electrolyte-R (PH 7.4) Solution: INTRAVENOUS | Qty: 1000 | Status: AC

## 2013-10-11 MED FILL — Lidocaine HCl IV Inj 20 MG/ML: INTRAVENOUS | Qty: 5 | Status: AC

## 2013-10-11 MED FILL — Heparin Sodium (Porcine) Inj 1000 Unit/ML: INTRAMUSCULAR | Qty: 10 | Status: AC

## 2013-10-11 MED FILL — Magnesium Sulfate Inj 50%: INTRAMUSCULAR | Qty: 10 | Status: AC

## 2013-10-11 MED FILL — Heparin Sodium (Porcine) Inj 1000 Unit/ML: INTRAMUSCULAR | Qty: 30 | Status: AC

## 2013-10-11 MED FILL — Sodium Bicarbonate IV Soln 8.4%: INTRAVENOUS | Qty: 50 | Status: AC

## 2013-10-11 MED FILL — Dexmedetomidine HCl IV Soln 200 MCG/2ML: INTRAVENOUS | Qty: 2 | Status: AC

## 2013-10-11 MED FILL — Potassium Chloride Inj 2 mEq/ML: INTRAVENOUS | Qty: 40 | Status: AC

## 2013-10-11 NOTE — Op Note (Signed)
Laurie Lambert, Laurie Lambert NO.:  [id number]  MEDICAL RECORD NO.:  [id number]  LOCATION:  2S11C                        FACILITY:  [facility]  PHYSICIAN:  Laurie Lambert, M.D.DATE OF BIRTH:  Aug 31, 1956  DATE OF PROCEDURE:  10/10/2013 DATE OF DISCHARGE:                              OPERATIVE REPORT   Intraoperative Transesophageal Echocardiographic Report  INDICATION FOR PROCEDURE:  Laurie Lambert is a 58 year old, Hispanic female patient who presents today for aortic valve replacement and coronary artery bypass grafting to be performed by Dr. Gilford Lambert.  She was brought to the holding area the morning of surgery where under local anesthesia with sedation, pulmonary artery and radial arterial lines are begun.  She has been transferred to the OR for routine induction of general anesthesia, after which the TEE probe was prepared and passed oropharyngeal into the stomach for imaging of the cardiac structures.  PRECARDIOPULMONARY BYPASS TEE EXAMINATION:  Left Ventricle:  The left ventricular chamber is seen initially in the short axis view.  It is remarkable for a pattern of concentric left ventricular hypertrophy. There is excellent contractile pattern appreciated in all segmental wall areas.  The ejection fraction is estimated to be greater than 50%. Papillary muscles are well outlined.  No masses are appreciated.  Mitral valve:  This is a normal-appearing mitral valve apparatus in the four-chamber view.  The valve itself is thin, compliant, mobile.  There is a normal coaptation point just below the level of the annular area. Color Doppler reveals essentially no regurgitant flow.  Left Atrium: There is normal left atrial chamber.  Aortic Valve:  The aortic valve is seen initially in the short axis view.  There are 3 cusps apparent.  There is heavy calcium noted in a nearly fixed non-coronary cusp with heavy calcium all across in a top, this noncoronary cusp leaflet  area.  The other leaflets are less affected with the calcium deposition.  They are a little more mobile. However, overall this is the impression of significant aortic stenosis. Calculated aortic valve area with the deep transgastric view shows an aortic valve area approximately 0.6 cm2.  Color Doppler across the valve in the long-axis view again shows a 3+ aortic stenotic jet.  It also shows a mild aortic insufficiency jet appreciated as well.  Right ventricle, this is normal.  Trabeculated, normally contractile right ventricular chamber.  Tricuspid valve normal, thin, compliant, mobile tricuspid valve.  There is very mild tricuspid regurgitant flow across itself appreciated.  POST CARDIOPULMONARY BYPASS:  Left Ventricle:  The left ventricular chamber is again viewed in the long and short axis views.  It is vigorous left ventricular chamber.  No essential changes were noted from the prebypass.  Aortic Valve:  In place of the diseased aortic valve, could now be seen the pericardial tissue valve.  It is well seated, well placed, and well visualized in both long and short axis views.  We could see the trileaflet valve edges easily.  They open satisfactorily with no obstruction to flow.  On color Doppler across this valve, there is also no aortic insufficiency appreciated.  This appears to be normally and satisfactorily replaced trileaflet pericardial tissue valve.  The rest  of the cardiac examination was as previously described.  The patient was then returned to the cardiac intensive care unit in stable condition.          ______________________________ Laurie Lambert, M.D.     JTM/MEDQ  D:  10/10/2013  T:  10/11/2013  Job:  [id number]

## 2013-10-11 NOTE — Progress Notes (Signed)
POD # 1 AVR, CABG  Problems with nausea and vomiting  Was able ambulate afterwards  BP 108/45  Pulse 90  Temp(Src) 99.2 F (37.3 C) (Oral)  Resp 14  Wt 158 lb 4.6 oz (71.8 kg)  SpO2 93%   Intake/Output Summary (Last 24 hours) at 10/11/13 1826 Last data filed at 10/11/13 1800  Gross per 24 hour  Intake 1219.73 ml  Output   1165 ml  Net  54.73 ml   Creatine 0.6, K= 3.8 Hct 24  Nausea seems better currently

## 2013-10-11 NOTE — Progress Notes (Signed)
UR Completed.  Kimm Ungaro Jane [phone] 10/11/2013  

## 2013-10-11 NOTE — Progress Notes (Signed)
1 Day Post-Op Procedure(s) (LRB): AORTIC VALVE REPLACEMENT (AVR) (N/A) CORONARY ARTERY BYPASS GRAFTING TIMES THREE USING LEFT INTERNAL MAMMARY ARTERY AND RIGHT LEG GREATER SAPHENOUS VEIN HARVESTED ENDOSCOPICALLY (N/A) INTRAOPERATIVE TRANSESOPHAGEAL ECHOCARDIOGRAM (N/A) Subjective:  No complaints  Objective: Vital signs in last 24 hours: Temp:  [95.7 F (35.4 C)-100.4 F (38 C)] 99.9 F (37.7 C) (01/06 0900) Pulse Rate:  [79-86] 84 (01/06 0900) Cardiac Rhythm:  [-] Normal sinus rhythm (01/06 0800) Resp:  [12-25] 17 (01/06 0900) BP: (82-134)/(43-68) 85/45 mmHg (01/06 0900) SpO2:  [96 %-100 %] 96 % (01/06 0900) Arterial Line BP: (92-134)/(31-68) 93/31 mmHg (01/06 0900) FiO2 (%):  [40 %-50 %] 40 % (01/05 1800) Weight:  [71.8 kg (158 lb 4.6 oz)] 71.8 kg (158 lb 4.6 oz) (01/06 0500)  Hemodynamic parameters for last 24 hours: PAP: (21-37)/(7-20) 26/7 mmHg CO:  [3 L/min-5.4 L/min] 5.2 L/min CI:  [1.8 L/min/m2-3.3 L/min/m2] 3.2 L/min/m2  Intake/Output from previous day: 01/05 0701 - 01/06 0700 In: 5567.5 [I.V.:3987.5; Blood:350; NG/GT:30; IV Piggyback:1200] Out: 1610 [Urine:2935; Emesis/NG output:100; Blood:700; Chest Tube:485] Intake/Output this shift: Total I/O In: 57.5 [I.V.:57.5] Out: 45 [Urine:40; Chest Tube:5]  General appearance: alert and cooperative Neurologic: intact Heart: regular rate and rhythm, S1, S2 normal, no murmur, click, rub or gallop Lungs: clear to auscultation bilaterally Extremities: edema mild Wound: dressing dry  Lab Results:  Recent Labs  10/10/13 2005 10/11/13 0415  WBC 8.7 8.3  HGB 9.2* 8.6*  HCT 27.7* 25.8*  PLT 129* 117*   BMET:  Recent Labs  10/10/13 2002 10/10/13 2005 10/11/13 0415  NA 139  --  139  K 3.9  --  3.3*  CL 107  --  103  CO2  --   --  22  GLUCOSE 114*  --  114*  BUN 9  --  12  CREATININE 0.50 0.48* 0.52  CALCIUM  --   --  7.8*    PT/INR:  Recent Labs  10/10/13 1405  LABPROT 19.4*  INR 1.69*   ABG     Component Value Date/Time   PHART 7.362 10/10/2013 1958   HCO3 24.4* 10/10/2013 1958   TCO2 25 10/10/2013 2002   ACIDBASEDEF 1.0 10/10/2013 1958   O2SAT 100.0 10/10/2013 1958   CBG (last 3)   Recent Labs  10/11/13 0029 10/11/13 0404 10/11/13 0757  GLUCAP 105* 108* 107*   CXR: clear  Assessment/Plan: S/P Procedure(s) (LRB): AORTIC VALVE REPLACEMENT (AVR) (N/A) CORONARY ARTERY BYPASS GRAFTING TIMES THREE USING LEFT INTERNAL MAMMARY ARTERY AND RIGHT LEG GREATER SAPHENOUS VEIN HARVESTED ENDOSCOPICALLY (N/A) INTRAOPERATIVE TRANSESOPHAGEAL ECHOCARDIOGRAM (N/A) Mobilize d/c tubes/lines See progression orders   LOS: 1 day    Ryden Wainer K 10/11/2013

## 2013-10-12 ENCOUNTER — Encounter (HOSPITAL_COMMUNITY): Payer: Self-pay | Admitting: Surgery

## 2013-10-12 ENCOUNTER — Inpatient Hospital Stay (HOSPITAL_COMMUNITY): Payer: BC Managed Care – PPO

## 2013-10-12 LAB — BASIC METABOLIC PANEL
BUN: 16 mg/dL (ref 6–23)
CHLORIDE: 106 meq/L (ref 96–112)
CO2: 25 meq/L (ref 19–32)
CREATININE: 0.55 mg/dL (ref 0.50–1.10)
Calcium: 8.2 mg/dL — ABNORMAL LOW (ref 8.4–10.5)
GFR calc non Af Amer: >90 mL/min (ref >90)
Glucose, Bld: 111 mg/dL — ABNORMAL HIGH (ref 70–99)
Potassium: 4.7 mEq/L (ref 3.7–5.3)
SODIUM: 140 meq/L (ref 137–147)

## 2013-10-12 LAB — CBC
HEMATOCRIT: 23.5 % — AB (ref 36.0–46.0)
Hemoglobin: 7.8 g/dL — ABNORMAL LOW (ref 12.0–15.0)
MCH: 27.9 pg (ref 26.0–34.0)
MCHC: 33.2 g/dL (ref 30.0–36.0)
MCV: 83.9 fL (ref 78.0–100.0)
PLATELETS: 119 10*3/uL — AB (ref 150–400)
RBC: 2.8 MIL/uL — AB (ref 3.87–5.11)
RDW: 16.3 % — AB (ref 11.5–15.5)
WBC: 10.8 10*3/uL — ABNORMAL HIGH (ref 4.0–10.5)

## 2013-10-12 LAB — GLUCOSE, CAPILLARY
GLUCOSE-CAPILLARY: 106 mg/dL — AB (ref 70–99)
Glucose-Capillary: 104 mg/dL — ABNORMAL HIGH (ref 70–99)
Glucose-Capillary: 95 mg/dL (ref 70–99)

## 2013-10-12 MED ORDER — INFLUENZA VAC SPLIT QUAD 0.5 ML IM SUSP: 0.5000 mL | INTRAMUSCULAR | Status: DC | PRN

## 2013-10-12 NOTE — Progress Notes (Signed)
2 Days Post-Op Procedure(s) (LRB): AORTIC VALVE REPLACEMENT (AVR) (N/A) CORONARY ARTERY BYPASS GRAFTING TIMES THREE USING LEFT INTERNAL MAMMARY ARTERY AND RIGHT LEG GREATER SAPHENOUS VEIN HARVESTED ENDOSCOPICALLY (N/A) INTRAOPERATIVE TRANSESOPHAGEAL ECHOCARDIOGRAM (N/A) Subjective:  No complaints  Objective: Vital signs in last 24 hours: Temp:  [98.4 F (36.9 C)-99.9 F (37.7 C)] 99 F (37.2 C) (01/07 0741) Pulse Rate:  [74-93] 80 (01/07 0700) Cardiac Rhythm:  [-] Normal sinus rhythm (01/07 0600) Resp:  [14-26] 23 (01/07 0700) BP: (85-132)/(33-82) 132/48 mmHg (01/07 0700) SpO2:  [91 %-97 %] 93 % (01/07 0700) Arterial Line BP: (93-116)/(31-40) 116/40 mmHg (01/06 1100) Weight:  [70.988 kg (156 lb 8 oz)] 70.988 kg (156 lb 8 oz) (01/07 0500)  Hemodynamic parameters for last 24 hours: PAP: (26-36)/(7-17) 36/17 mmHg  Intake/Output from previous day: 01/06 0701 - 01/07 0700 In: 927 [I.V.:527; IV Piggyback:400] Out: 845 [Urine:790; Chest Tube:55] Intake/Output this shift:    General appearance: alert and cooperative Heart: regular rate and rhythm, S1, S2 normal, no murmur, click, rub or gallop Lungs: clear to auscultation bilaterally Extremities: edema mild Wound: incision ok  Lab Results:  Recent Labs  10/11/13 1700 10/11/13 1720 10/12/13 0440  WBC 10.5  --  10.8*  HGB 8.6* 8.2* 7.8*  HCT 25.7* 24.0* 23.5*  PLT 117*  --  119*   BMET:  Recent Labs  10/11/13 0415  10/11/13 1720 10/12/13 0440  NA 139  --  139 140  K 3.3*  --  3.8 4.7  CL 103  --  102 106  CO2 22  --   --  25  GLUCOSE 114*  --  113* 111*  BUN 12  --  16 16  CREATININE 0.52  < > 0.60 0.55  CALCIUM 7.8*  --   --  8.2*  < > = values in this interval not displayed.  PT/INR:  Recent Labs  10/10/13 1405  LABPROT 19.4*  INR 1.69*   ABG    Component Value Date/Time   PHART 7.362 10/10/2013 1958   HCO3 24.4* 10/10/2013 1958   TCO2 22 10/11/2013 1720   ACIDBASEDEF 1.0 10/10/2013 1958   O2SAT 100.0  10/10/2013 1958   CBG (last 3)   Recent Labs  10/11/13 1219 10/11/13 2142 10/11/13 2347  GLUCAP 100* 113* 113*   CXR:  Mild left basilar atelectasis  Assessment/Plan: S/P Procedure(s) (LRB): AORTIC VALVE REPLACEMENT (AVR) (N/A) CORONARY ARTERY BYPASS GRAFTING TIMES THREE USING LEFT INTERNAL MAMMARY ARTERY AND RIGHT LEG GREATER SAPHENOUS VEIN HARVESTED ENDOSCOPICALLY (N/A) INTRAOPERATIVE TRANSESOPHAGEAL ECHOCARDIOGRAM (N/A)  She was put back on neo last night for SBP in the 90's. Her BP is ok this am. Will wean this off. Hold off on BB until BP stable off neo Wait on diuresis. Glucose under good control and Hgb A1c 6.0 preop. Needs to stay on a diabetic diet. IS and ambulation   LOS: 2 days    BARTLE,BRYAN K 10/12/2013

## 2013-10-12 NOTE — Progress Notes (Signed)
Patient ID: Laurie Lambert, female   DOB: 05-09-1956, 58 y.o.   MRN: [id number]  SICU evening rounds:  Hemodynamically stable off neo.  Ambulated but requires a lot of encouragement.  Urine output ok

## 2013-10-13 ENCOUNTER — Encounter (HOSPITAL_COMMUNITY): Payer: Self-pay | Admitting: Anesthesiology

## 2013-10-13 LAB — GLUCOSE, CAPILLARY: Glucose-Capillary: 90 mg/dL (ref 70–99)

## 2013-10-13 MED ORDER — OXYCODONE HCL 5 MG PO TABS
5.0000 mg | ORAL_TABLET | ORAL | Status: DC | PRN
  Administered 2013-10-15 – 2013-10-17 (×3): 5 mg via ORAL
  Filled 2013-10-13 (×3): qty 1

## 2013-10-13 MED ORDER — SODIUM CHLORIDE 0.9 % IJ SOLN
3.0000 mL | Freq: Two times a day (BID) | INTRAMUSCULAR | Status: DC
  Administered 2013-10-13 – 2013-10-17 (×6): 3 mL via INTRAVENOUS

## 2013-10-13 MED ORDER — ASPIRIN EC 325 MG PO TBEC
325.0000 mg | DELAYED_RELEASE_TABLET | Freq: Every day | ORAL | Status: DC
  Administered 2013-10-13 – 2013-10-17 (×5): 325 mg via ORAL
  Filled 2013-10-13 (×5): qty 1

## 2013-10-13 MED ORDER — FERROUS GLUCONATE 324 (38 FE) MG PO TABS
324.0000 mg | ORAL_TABLET | Freq: Every day | ORAL | Status: DC
  Administered 2013-10-14 – 2013-10-17 (×4): 324 mg via ORAL
  Filled 2013-10-13 (×6): qty 1

## 2013-10-13 MED ORDER — DOCUSATE SODIUM 100 MG PO CAPS
200.0000 mg | ORAL_CAPSULE | Freq: Every day | ORAL | Status: DC
  Administered 2013-10-13 – 2013-10-17 (×4): 200 mg via ORAL
  Filled 2013-10-13 (×4): qty 2

## 2013-10-13 MED ORDER — ONDANSETRON HCL 4 MG/2ML IJ SOLN
4.0000 mg | Freq: Four times a day (QID) | INTRAMUSCULAR | Status: DC | PRN
  Administered 2013-10-16: 4 mg via INTRAVENOUS
  Filled 2013-10-13: qty 2

## 2013-10-13 MED ORDER — TRAMADOL HCL 50 MG PO TABS
50.0000 mg | ORAL_TABLET | ORAL | Status: DC | PRN
  Administered 2013-10-14: 100 mg via ORAL
  Administered 2013-10-17: 50 mg via ORAL
  Filled 2013-10-13: qty 1
  Filled 2013-10-13: qty 2

## 2013-10-13 MED ORDER — ONDANSETRON HCL 4 MG PO TABS
4.0000 mg | ORAL_TABLET | Freq: Four times a day (QID) | ORAL | Status: DC | PRN
  Administered 2013-10-14: 4 mg via ORAL
  Filled 2013-10-13: qty 1

## 2013-10-13 MED ORDER — SODIUM CHLORIDE 0.9 % IV SOLN: 250.0000 mL | INTRAVENOUS | Status: DC | PRN

## 2013-10-13 MED ORDER — ACETAMINOPHEN 325 MG PO TABS: 650.0000 mg | ORAL_TABLET | Freq: Four times a day (QID) | ORAL | Status: DC | PRN

## 2013-10-13 MED ORDER — BISACODYL 10 MG RE SUPP: 10.0000 mg | Freq: Every day | RECTAL | Status: DC | PRN

## 2013-10-13 MED ORDER — FUROSEMIDE 40 MG PO TABS
40.0000 mg | ORAL_TABLET | Freq: Every day | ORAL | Status: DC
  Administered 2013-10-13: 40 mg via ORAL
  Filled 2013-10-13 (×2): qty 1

## 2013-10-13 MED ORDER — SIMVASTATIN 10 MG PO TABS
10.0000 mg | ORAL_TABLET | Freq: Every day | ORAL | Status: DC
  Administered 2013-10-13 – 2013-10-16 (×4): 10 mg via ORAL
  Filled 2013-10-13 (×5): qty 1

## 2013-10-13 MED ORDER — MOVING RIGHT ALONG BOOK
Freq: Once | Status: AC
  Administered 2013-10-13: 17:00:00
  Filled 2013-10-13: qty 1

## 2013-10-13 MED ORDER — POTASSIUM CHLORIDE CRYS ER 20 MEQ PO TBCR
20.0000 meq | EXTENDED_RELEASE_TABLET | Freq: Every day | ORAL | Status: AC
  Administered 2013-10-14 – 2013-10-16 (×3): 20 meq via ORAL
  Filled 2013-10-13 (×3): qty 1

## 2013-10-13 MED ORDER — SODIUM CHLORIDE 0.9 % IJ SOLN: 3.0000 mL | INTRAMUSCULAR | Status: DC | PRN

## 2013-10-13 MED ORDER — FAMOTIDINE 20 MG PO TABS
20.0000 mg | ORAL_TABLET | Freq: Two times a day (BID) | ORAL | Status: DC
  Administered 2013-10-13 – 2013-10-17 (×8): 20 mg via ORAL
  Filled 2013-10-13 (×9): qty 1

## 2013-10-13 MED ORDER — BISACODYL 5 MG PO TBEC: 10.0000 mg | DELAYED_RELEASE_TABLET | Freq: Every day | ORAL | Status: DC | PRN

## 2013-10-13 NOTE — Progress Notes (Signed)
Attempted to call report x 1. Laurie Lambert

## 2013-10-13 NOTE — Progress Notes (Signed)
Report called to Laurie Sen[place] RN. Pt to transfer to 2W-12 via ambulation, VS stable, meds in chart, husband and belongings at bedside. No questions at this time, no complaints of pain. Elona Yinger L

## 2013-10-13 NOTE — Progress Notes (Signed)
3 Days Post-Op Procedure(s) (LRB): AORTIC VALVE REPLACEMENT (AVR) (N/A) CORONARY ARTERY BYPASS GRAFTING TIMES THREE USING LEFT INTERNAL MAMMARY ARTERY AND RIGHT LEG GREATER SAPHENOUS VEIN HARVESTED ENDOSCOPICALLY (N/A) INTRAOPERATIVE TRANSESOPHAGEAL ECHOCARDIOGRAM (N/A) Subjective:  No complaints Her husband is here with her this am.  Objective: Vital signs in last 24 hours: Temp:  [98.1 F (36.7 C)-98.7 F (37.1 C)] 98.1 F (36.7 C) (01/08 0746) Pulse Rate:  [79-89] 84 (01/08 0600) Cardiac Rhythm:  [-] Normal sinus rhythm (01/08 0600) Resp:  [17-27] 21 (01/08 0600) BP: (96-137)/(34-111) 136/53 mmHg (01/08 0600) SpO2:  [94 %-99 %] 98 % (01/08 0600) Weight:  [70.4 kg (155 lb 3.3 oz)] 70.4 kg (155 lb 3.3 oz) (01/08 0500)  Hemodynamic parameters for last 24 hours:    Intake/Output from previous day: 01/07 0701 - 01/08 0700 In: 460 [I.V.:460] Out: 152 [Urine:151; Stool:1] Intake/Output this shift:    General appearance: alert and cooperative Heart: regular rate and rhythm, S1, S2 normal, no murmur, click, rub or gallop Lungs: clear to auscultation bilaterally Extremities: edema mild Wound: incision ok  Lab Results:  Recent Labs  10/11/13 1700 10/11/13 1720 10/12/13 0440  WBC 10.5  --  10.8*  HGB 8.6* 8.2* 7.8*  HCT 25.7* 24.0* 23.5*  PLT 117*  --  119*   BMET:  Recent Labs  10/11/13 0415  10/11/13 1720 10/12/13 0440  NA 139  --  139 140  K 3.3*  --  3.8 4.7  CL 103  --  102 106  CO2 22  --   --  25  GLUCOSE 114*  --  113* 111*  BUN 12  --  16 16  CREATININE 0.52  < > 0.60 0.55  CALCIUM 7.8*  --   --  8.2*  < > = values in this interval not displayed.  PT/INR:  Recent Labs  10/10/13 1405  LABPROT 19.4*  INR 1.69*   ABG    Component Value Date/Time   PHART 7.362 10/10/2013 1958   HCO3 24.4* 10/10/2013 1958   TCO2 22 10/11/2013 1720   ACIDBASEDEF 1.0 10/10/2013 1958   O2SAT 100.0 10/10/2013 1958   CBG (last 3)   Recent Labs  10/11/13 2347  10/12/13 0359 10/12/13 0739  GLUCAP 113* 106* 95    Assessment/Plan: S/P Procedure(s) (LRB): AORTIC VALVE REPLACEMENT (AVR) (N/A) CORONARY ARTERY BYPASS GRAFTING TIMES THREE USING LEFT INTERNAL MAMMARY ARTERY AND RIGHT LEG GREATER SAPHENOUS VEIN HARVESTED ENDOSCOPICALLY (N/A) INTRAOPERATIVE TRANSESOPHAGEAL ECHOCARDIOGRAM (N/A) She is hemodynamically stable off neo. Will start gentle diuresis. Hold off on beta blocker until diuresed. Expected acute blood loss anemia: observe and start Fe2+ Mobilize Diuresis Plan for transfer to step-down: see transfer orders   LOS: 3 days    BARTLE,BRYAN K 10/13/2013

## 2013-10-14 ENCOUNTER — Inpatient Hospital Stay (HOSPITAL_COMMUNITY): Payer: BC Managed Care – PPO

## 2013-10-14 LAB — GLUCOSE, CAPILLARY
GLUCOSE-CAPILLARY: 95 mg/dL (ref 70–99)
Glucose-Capillary: 90 mg/dL (ref 70–99)

## 2013-10-14 LAB — BASIC METABOLIC PANEL
BUN: 10 mg/dL (ref 6–23)
CHLORIDE: 101 meq/L (ref 96–112)
CO2: 21 meq/L (ref 19–32)
Calcium: 8.1 mg/dL — ABNORMAL LOW (ref 8.4–10.5)
Creatinine, Ser: 0.42 mg/dL — ABNORMAL LOW (ref 0.50–1.10)
GFR calc Af Amer: >90 mL/min (ref >90)
GFR calc non Af Amer: >90 mL/min (ref >90)
GLUCOSE: 86 mg/dL (ref 70–99)
POTASSIUM: 3 meq/L — AB (ref 3.7–5.3)
Sodium: 141 mEq/L (ref 137–147)

## 2013-10-14 LAB — CBC
HEMATOCRIT: 23.7 % — AB (ref 36.0–46.0)
HEMOGLOBIN: 8.1 g/dL — AB (ref 12.0–15.0)
MCH: 27.9 pg (ref 26.0–34.0)
MCHC: 34.2 g/dL (ref 30.0–36.0)
MCV: 81.7 fL (ref 78.0–100.0)
Platelets: 174 10*3/uL (ref 150–400)
RBC: 2.9 MIL/uL — ABNORMAL LOW (ref 3.87–5.11)
RDW: 16.5 % — ABNORMAL HIGH (ref 11.5–15.5)
WBC: 6.5 10*3/uL (ref 4.0–10.5)

## 2013-10-14 MED ORDER — AMIODARONE IV BOLUS ONLY 150 MG/100ML
150.0000 mg | Freq: Once | INTRAVENOUS | Status: AC
  Administered 2013-10-14: 150 mg via INTRAVENOUS
  Filled 2013-10-14: qty 100

## 2013-10-14 MED ORDER — LISINOPRIL 2.5 MG PO TABS
2.5000 mg | ORAL_TABLET | Freq: Every day | ORAL | Status: DC
  Administered 2013-10-14 – 2013-10-17 (×3): 2.5 mg via ORAL
  Filled 2013-10-14 (×4): qty 1

## 2013-10-14 MED ORDER — AMIODARONE HCL IN DEXTROSE 360-4.14 MG/200ML-% IV SOLN
60.0000 mg/h | INTRAVENOUS | Status: AC
  Administered 2013-10-14: 60 mg/h via INTRAVENOUS
  Filled 2013-10-14: qty 200

## 2013-10-14 MED ORDER — FUROSEMIDE 40 MG PO TABS: 40.0000 mg | ORAL_TABLET | Freq: Every day | ORAL | Status: DC

## 2013-10-14 MED ORDER — AMIODARONE HCL IN DEXTROSE 360-4.14 MG/200ML-% IV SOLN
30.0000 mg/h | INTRAVENOUS | Status: DC
  Filled 2013-10-14 (×2): qty 200

## 2013-10-14 MED ORDER — METOPROLOL TARTRATE 12.5 MG HALF TABLET
12.5000 mg | ORAL_TABLET | Freq: Two times a day (BID) | ORAL | Status: DC
  Administered 2013-10-14 – 2013-10-17 (×6): 12.5 mg via ORAL
  Filled 2013-10-14 (×8): qty 1

## 2013-10-14 MED ORDER — POTASSIUM CHLORIDE CRYS ER 20 MEQ PO TBCR
30.0000 meq | EXTENDED_RELEASE_TABLET | Freq: Once | ORAL | Status: AC
  Administered 2013-10-14: 30 meq via ORAL
  Filled 2013-10-14: qty 1

## 2013-10-14 MED ORDER — POTASSIUM CHLORIDE CRYS ER 20 MEQ PO TBCR
40.0000 meq | EXTENDED_RELEASE_TABLET | Freq: Once | ORAL | Status: AC
  Administered 2013-10-14: 40 meq via ORAL
  Filled 2013-10-14: qty 2

## 2013-10-14 MED ORDER — AMIODARONE HCL 200 MG PO TABS
400.0000 mg | ORAL_TABLET | Freq: Three times a day (TID) | ORAL | Status: DC
  Administered 2013-10-14 – 2013-10-17 (×9): 400 mg via ORAL
  Filled 2013-10-14 (×11): qty 2

## 2013-10-14 NOTE — Progress Notes (Signed)
Tel shows patient back in sinus rhythm, amiodarone infusing per orders Joylene Draft A

## 2013-10-14 NOTE — Discharge Summary (Signed)
[address]       [place] [address]             [phone]       Torii Rakestraw [birth year] 58 y.o. OF:[id number]  10/10/2013   Gaye Pollack, MD  CAD SEVERE AS  HPI: At time of consultation The patient is a 58 year old [place] woman who speaks no [place] and is here with an interpreter today. She was initially seen by Dr. Angelena Form on 07/06/2013 for complaints of some chest pain radiating to the back and some palpitations with a feeling of her heart beating in her left ear. She was noted to have a heart murmur and leg edema by her primary physician. An echo showed severe AS with a mean gradient of 54 mm Hg and a peak gradient of 107 mm Hg with normal LV function and mild MR. A stress myoview showed possible inferior ischemia. A TEE confirmed severe AS with a trileaflet valve and an AVA of 0.7 cm2 with moderate AI. Cardiac cath showed severe AS with a mean gradient of 46 and a peak to peak gradient of 56. Coronary angiography showed the LAD to have a 50% mid vessel stenosis. The RCA is a large dominant vessel with a long 99% stenosis in the mid vessel followed by 99% stenosis in the distal vessel she was seen by Dr. Cyndia Bent who felt she was a candidate for aortic valve replacement as well as coronary artery bypass grafting and she was admitted this hospitalization for the procedure. Past Medical History  Diagnosis Date  . Hypertension  . Edema leg  . Heart murmur  . Hyperlipemia  Past Surgical History  Procedure Laterality Date  . Breast cyst removal  . Ankle surgery Right  25 years ago  . Tee without cardioversion N/A 08/12/2013  Procedure: TRANSESOPHAGEAL ECHOCARDIOGRAM (TEE); Surgeon: Dorothy Spark, MD; Location: [facility] ENDOSCOPY; Service: Cardiovascular; Laterality: N/A; Spanish Interpreter needed  Family History  Problem Relation Age of Onset  . CAD Neg Hx  Social History  History  Substance Use Topics  . Smoking status: Never Smoker  . Smokeless  tobacco: Never Used  . Alcohol Use: No  Lives with her husband in [place]. 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 Travels to other cities during the week to work and comes home on the weekend.  Current Outpatient Prescriptions  Medication Sig Dispense Refill  . lovastatin (MEVACOR) 20 MG tablet Take 20 mg by mouth at bedtime.  . triamterene-hydrochlorothiazide (DYAZIDE) 37.5-25 MG per capsule Take 1 capsule by mouth daily.  No current facility-administered medications for this visit.  No Known Allergies      Hospital Course: The patient was admitted and on 10/10/2013 she was taken to the operating room at which time she underwent the following procedure: 10/10/2013  Surgeon: Gaye Pollack, MD  First Assistant: Jadene Pierini, PA-C  Preoperative Diagnosis: Severe multi-vessel coronary artery disease  Postoperative Diagnosis: Same  Procedure:  1. Median Sternotomy 2. Extracorporeal circulation 3. Coronary artery bypass grafting x 3  Left internal mammary graft to the LAD  Sequential SVG to AM and PDA 4. Aortic valve replacement using a 21 mm Edwards pericardial valve  5. Endoscopic harvesting of right greater saphenous vein  Anesthesia: General Endotracheal  The patient was then transported to the surgical intensive care unit in critical but stable condition.  Postoperative hospital course: Overall the patient has done well. She was extubated uneventfully. She has remained neurologically intact. All  routine lines, monitors and drainage devices have been discontinued in the standard fashion. She has developed postoperative atrial fibrillation and has been placed on beta blocker as well as amiodarone. She does have an acute blood loss anemia and hematocrit has stabilized at 23. She has been placed on oral iron supplement. Her blood sugars have been under adequate control. Preoperative hemoglobin A1c is 6.0 so she is recommended he continued cardiac as well as dietary diabetic restrictions.  Incisions are noted to be healing well without evidence of infection. She is gradually increasing activities using standard protocols. Oxygen has been weaned and she maintains good saturations on room air. She does have some mild postoperative volume overload. Currently her status is felt to be tentatively stable the next 48-72 hours pending ongoing reevaluation of her recovery.      Recent Labs  10/12/13 0440 10/14/13 0551  NA 140 141  K 4.7 3.0*  CL 106 101  CO2 25 21  GLUCOSE 111* 86  BUN 16 10  CALCIUM 8.2* 8.1*    Recent Labs  10/12/13 0440 10/14/13 0551  WBC 10.8* 6.5  HGB 7.8* 8.1*  HCT 23.5* 23.7*  PLT 119* 174   No results found for this basename: INR,  in the last 72 hours   Discharge Instructions:  The patient is discharged to home with extensive instructions on wound care and progressive ambulation.  They are instructed not to drive or perform any heavy lifting until returning to see the physician in his office.  Discharge Diagnosis:  CAD SEVERE AS S/P CABG/AVR Postoperative acute blood loss anemia and Secondary Diagnosis: Patient Active Problem List   Diagnosis Date Noted  . S/P AVR (aortic valve replacement) 10/10/2013  . Aortic valve stenosis 08/02/2013  . Chest pain 07/06/2013  . Murmur 07/06/2013   Past Medical History  Diagnosis Date  . Hypertension   . Edema leg   . Heart murmur   . Hyperlipemia   . Coronary artery disease     Medications at discharge:    Medication List    STOP taking these medications       triamterene-hydrochlorothiazide 37.5-25 MG per capsule  Commonly known as:  DYAZIDE      TAKE these medications       amiodarone 400 MG tablet  Commonly known as:  PACERONE  Take 1 tablet (400 mg total) by mouth 2 (two) times daily. For 1 week, then decrease to 400 mg once daily     aspirin 325 MG EC tablet  Take 1 tablet (325 mg total) by mouth daily.     ferrous gluconate 324 MG tablet  Commonly known as:  FERGON    Take 1 tablet (324 mg total) by mouth daily with breakfast.     lisinopril 2.5 MG tablet  Commonly known as:  PRINIVIL,ZESTRIL  Take 1 tablet (2.5 mg total) by mouth daily.     lovastatin 20 MG tablet  Commonly known as:  MEVACOR  Take 20 mg by mouth at bedtime.     metoprolol tartrate 25 MG tablet  Commonly known as:  LOPRESSOR  Take 0.5 tablets (12.5 mg total) by mouth 2 (two) times daily.     oxyCODONE 5 MG immediate release tablet  Commonly known as:  Oxy IR/ROXICODONE  Take 1-2 tablets (5-10 mg total) by mouth every 3 (three) hours as needed for severe pain.     traMADol 50 MG tablet  Commonly known as:  ULTRAM  Take 1-2 tablets (50-100 mg total)  by mouth every 4 (four) hours as needed for moderate pain.         The patient has been discharged on:   1.Beta Blocker:  Yes [  y ]                              No   [   ]                              If No, reason:  2.Ace Inhibitor/ARB: Yes [ y  ]                                     No  [    ]                                     If No, reason:  3.Statin:   Yes [ y  ]                  No  [   ]                  If No, reason:  4.Ecasa:  Yes  [ y  ]                  No   [   ]                  If No, reason:  Disposition:   Patient's condition is [place], PA-C 10/14/2013  12:27 PM

## 2013-10-14 NOTE — Progress Notes (Signed)
CARDIAC REHAB PHASE I   PRE:  Rate/Rhythm: 83 SR    BP: sitting 106/50    SaO2: 94 RA  MODE:  Ambulation: 500 ft   POST:  Rate/Rhythm: 97 SR    BP: sitting 120/56     SaO2: 95 RA  Pt with some difficulty getting OOB due to language barrier and lack of education. Interpreter present and explained method. Once up and walking pt steady, felt good using RW. Would like a RW for home.  Return to recliner after walk. Discussed ed through interpreter, husband present as well. Good reception. Discussed low carb diet as well. Printed diet and ex in [place]. Pt interested in [place] and will send referral to [place].  [phone]  Laurie Lambert [place] CES, ACSM 10/14/2013 11:59 AM

## 2013-10-14 NOTE — Care Management Note (Unsigned)
    Page 1 of 1   10/14/2013     4:59:57 PM   CARE MANAGEMENT NOTE 10/14/2013  Patient:  Laurie Lambert, Laurie Lambert   Account Number:  [id number]  Date Initiated:  10/11/2013  Documentation initiated by:  Laurie Lambert  Subjective/Objective Assessment:   post op CABG x3 and AVR.     Action/Plan:   Anticipated DC Date:  10/14/2013   Anticipated DC Plan:  [place]  CM consult      Choice offered to / List presented to:     DME arranged  [facility]      DME agency  [place].        Status of service:  In process, will continue to follow Medicare Important Message given?   (If response is "NO", the following Medicare IM given date fields will be blank) Date Medicare IM given:   Date Additional Medicare IM given:    Discharge Disposition:    Per UR Regulation:  Reviewed for med. necessity/level of care/duration of stay  If discussed at [place] of Stay Meetings, dates discussed:    Comments:  Contact:  Laurie Lambert,Laurie Lambert Spouse [phone]  10/14/13 Laurie Methot,Laurie Lambert,Laurie Lambert [phone] [place], FAMILY TO PROVIDE CARE AT [place].  REFERRAL TO [facility] FOR DME NEEDS.

## 2013-10-14 NOTE — Progress Notes (Addendum)
      [address]       [place] [address]             [phone]        4 Days Post-Op Procedure(s) (LRB): AORTIC VALVE REPLACEMENT (AVR) (N/A) CORONARY ARTERY BYPASS GRAFTING TIMES THREE USING LEFT INTERNAL MAMMARY ARTERY AND RIGHT LEG GREATER SAPHENOUS VEIN HARVESTED ENDOSCOPICALLY (N/A) INTRAOPERATIVE TRANSESOPHAGEAL ECHOCARDIOGRAM (N/A)  Subjective: Patient with complaints "of balling up of skin" proximal sternal wound.  Objective: Vital signs in last 24 hours: Temp:  [98.5 F (36.9 C)-100.1 F (37.8 C)] 98.5 F (36.9 C) (01/09 0510) Pulse Rate:  [85-135] 135 (01/09 0802) Cardiac Rhythm:  [-] Atrial fibrillation (01/09 0802) Resp:  [16-24] 17 (01/09 0510) BP: (95-139)/(52-90) 109/65 mmHg (01/09 0849) SpO2:  [95 %-100 %] 95 % (01/09 0802) Weight:  [67.2 kg (148 lb 2.4 oz)] 67.2 kg (148 lb 2.4 oz) (01/09 0510)  Pre op weight  67 kg Current Weight  10/14/13 67.2 kg (148 lb 2.4 oz)     Intake/Output from previous day: 01/08 0701 - 01/09 0700 In: 840 [P.O.:840] Out: 1400 [Urine:1400]   Physical Exam:  Cardiovascular: RRR, no murmurs, gallops, or rubs. Pulmonary: Mostly clear to auscultation bilaterally; no rales, wheezes, or rhonchi. Abdomen: Soft, non tender, bowel sounds present. Extremities: Mild bilateral lower extremity edema. Mild ecchymosis right thigh. Wounds: Clean and dry.  No erythema or signs of infection.  Lab Results: CBC: Recent Labs  10/12/13 0440 10/14/13 0551  WBC 10.8* 6.5  HGB 7.8* 8.1*  HCT 23.5* 23.7*  PLT 119* 174   BMET:  Recent Labs  10/12/13 0440 10/14/13 0551  NA 140 141  K 4.7 3.0*  CL 106 101  CO2 25 21  GLUCOSE 111* 86  BUN 16 10  CREATININE 0.55 0.42*  CALCIUM 8.2* 8.1*    PT/INR:  Lab Results  Component Value Date   INR 1.69* 10/10/2013   INR 1.04 10/07/2013   INR 1.2* 08/02/2013   ABG:  INR: Will add last result for INR, ABG once components are confirmed Will add last 4 CBG results  once components are confirmed  Assessment/Plan:  1. CV - Had a fib with RVR earlier.SR in the 70's at time of exam. On Amiodarone gttp. Will start Lopressor 12.5 bid with parameters. 2.  Pulmonary - CXR this am shows no pneumothorax, improving lung aeration, small pleural effusions, and bibasilar atelectasis. Encourage incentive spirometer. 3.Mild volume overload-almost at pre op weight.  4.  Acute blood loss anemia - H and H stable at 8.1 and 23.7. Continue Fergon. 5.Thrombocytopenia resolved as platelets up to 174,000 6.Regarding "lump at top of sternum", explained to patient will gradually decrease. There is no infection. 7.Supplement potassium 8. Possible discharge Sunday or Monday  ZIMMERMAN,DONIELLE MPA-C 10/14/2013,11:46 AM   Chart reviewed, patient examined, agree with above. She is back in sinus rhythm. Will switch to po amio to avoid phlebitis since she only has a peripheral IV in her hand.

## 2013-10-14 NOTE — Discharge Instructions (Signed)
Ciruga de bypass coronario - Cuidados posteriores (Coronary Artery Bypass Grafting, Care After) Siga estas instrucciones durante las prximas semanas. Estas indicaciones le proporcionan informacin general acerca de cmo deber cuidarse despus del procedimiento. El mdico tambin podr darle instrucciones ms especficas. El tratamiento se ha planificado de acuerdo a las prcticas mdicas actuales, pero a veces se producen problemas. Comunquese con el mdico si tiene algn problema o tiene dudas despus del procedimiento. QU ESPERAR DESPUS DEL Penalosa recuperacin de [place] a corazn abierto es distinta para Higher education careers adviser. Algunas personas se sienten bien luego de 3 o 4 semanas, mientras que para otras esto puede llevar ms [employer] Cayleen Benjamin. Despus del procedimiento, es tpico tener las siguientes sensaciones:  Nuseas y falta de apetito.   Estreimiento.  Debilidad y fatiga.   Depresin o irritabilidad.   Dolor o molestias en el sitio de la incisin. [place] slo medicamentos de venta libre o recetados, [place] todos los medicamentos exactamente como se le indic. No deje de tomar los medicamentos ni comience a tomar medicamentos nuevos sin Teacher, adult education primero con su mdico.   Tome todos los medicamentos como le indic el mdico.  Secondary school teacher ejercicios de respiracin como le indique su mdico. Use un dispositivo llamado espirmetro incentivo para realizar respiraciones profundas varias veces por da. Presione su pecho con una almohada o sus brazos al respirar profundamente y toser.  [place] reas de la incisin limpias, secas y protegidas. Retire o cambie los apsitos (vendajes) tal como le indic su mdico. Bethann [employer] cintas [id number] sobre el rea de la incisin. No retire las cintas. [place] por s mismas.  Controle diariamente la incisin y la zona que la rodea para observar la aparicin de  enrojecimiento, hinchazn, aumento de la secrecin o lquido.  Si le hicieron incisiones en las piernas, haga lo siguiente:  Evite cruzar las piernas.   Evite estar sentado durante largos perodos. Cambie la posicin cada hora.   Eleve la/s pierna/s al estar sentado.   Use medias de compresin como le haya indicado su mdico. Estas medias ayudan a evitar la formacin de cogulos en las piernas.  Puede darse una ducha si su mdico la autoriza. Hasta ese momento, slo tome baos de esponja. Seque la incisin con golpecitos. No la frote con un pao ni con la toalla. No tome baos de inmersin hasta que el mdico la autorice.  Consuma alimentos con fibra, como cereales integrales, legumbres, frutos secos, frutas y verduras. Puede comer carnes si son cortes magros. Evite los alimentos enlatados, procesados y fritos.  Beba suficiente lquido para Consulting civil engineer orina clara o de color amarillo plido.  Controle su peso a diario. Esto es importante porque lo ayudar a saber si est reteniendo lquidos, lo cual puede hacer trabajar ms a su corazn y sus pulmones.   Limite las actividades segn las indicaciones del mdico. Es posible que le indiquen que:  Interrumpa cualquier actividad de inmediato si siente dolor en el pecho, falta de aire, latidos irregulares, o [employer]. Obtenga ayuda de inmediato si observa cualquiera de esos sntomas.  Haga mucho reposo, pero muvase con frecuencia, durante breves perodos o haga caminatas cortas segn lo que le indique su mdico. Aumente sus actividades gradualmente. Es posible que requiera terapia fsica o rehabilitacin cardaca para ayudar a fortalecer sus msculos y aumentar su resistencia.  Evite levantar, empujar o tirar de objetos que pesen ms de 10 libras (4.5 kg) durante al  menos 6 semanas luego de la [place].  No conduzca vehculos hasta que el mdico lo autorice.  Consulte a su mdico cundo podr regresar a trabajar y Teacher, early years/pre actividad  sexual.  Concurra a las consultas de control con su mdico segn las indicaciones.  SOLICITE ATENCIN MDICA SI:  Observa hinchazn, inflamacin, aumenta el dolor o el drenaje en el sitio de la incisin.   Tiene fiebre.   Observa hinchazn en los tobillos o en los pies.   Siente dolor en las piernas.   Sube ms de 2 libras (0.9 kg) de Engineer, materials.  Siente nuseas o vomita.  Tiene diarrea. SOLICITE ATENCIN MDICA DE INMEDIATO SI:  Tiene dolor en el pecho que se extiende [place] su mandbula o brazos.  Le falta el aire.   Siente latidos cardacos irregulares.   Nota un ruido como "clic" en el esternn al [employer].   Siente debilidad o adormecimiento en los brazos o en las piernas.  Se siente mareado o sufre un desmayo.  ASEGRESE DE QUE:  Comprende estas instrucciones.  Controlar su afeccin.  Recibir ayuda de inmediato si no mejora o si empeora. Document Released: 01/17/2011 Document Revised: 05/25/2013 [facility] Patient Information 2014 [place], [place]. Reseccin endoscpica de la vena safena - Cuidados posteriores  (Endoscopic Saphenous Vein Harvesting, Care After) Siga estas instrucciones durante las prximas semanas. Estas indicaciones le proporcionan informacin general acerca de cmo deber cuidarse despus del procedimiento. El mdico tambin podr darle instrucciones especficas. El tratamiento ha sido planificado segn las prcticas mdicas actuales, pero en algunos casos pueden ocurrir problemas. Comunquese con el mdico si tiene algn problema o tiene preguntas despus del procedimiento.  [place] los medicamentos para Glass blower/designer que le recet el mdico. Siga cuidadosamente las indicaciones. No tome ningn medicamento de venta libre para el dolor hasta que su mdico lo autorice. Algunos medicamentos para el dolor pueden causar problemas hemorrgicos durante algunas semanas despus de la  [place].  Siga las instrucciones de su mdico con respecto a Child psychotherapist. Probablemente no le permitirn conducir despus de una ciruga cardaca.  Tome los [employer] le recet el cirujano. El mdico debe controlar todos los medicamentos que tomaba antes de la ciruga cardaca antes de que comience a tomarlos nuevamente. Cuidado de las heridas   Consulte a su mdico cunto tiempo deber usar la media o el vendaje elstico.  Controle la zona de los cortes quirrgicos (incisiones) cada vez que se cambie los vendajes. Observe si hay enrojecimiento o inflamacin.  Tendr que volver para que le saquen los puntos (suturas) o las grapas. Consulte al cirujano cundo deber hacerlo.  Pregunte al Gretchen Short cundo podr volver a trabajar. Actividad   Trate de [employer] las piernas elevadas cuando est sentado.  Haga los ejercicios que los mdicos le indicaron. Podrn incluir ejercicios de respiracin profunda, toser, caminar y 54. SOLICITE ATENCIN MDICA SI:   Tiene dudas relacionadas con los medicamentos.  Aumenta el dolor en la pierna, especialmente si el medicamento para el dolor deja de Chief of Staff.  Aparecen nuevos hematomas en la pierna.  La pierna se hincha, siente presin o se vuelve roja.  Siente adormecimiento en la pierna. SOLICITE ATENCIN MDICA DE INMEDIATO SI:   El dolor empeora.  [place].  La herida est roja, hinchada o caliente.  Siente dolor en el pecho.  Tiene dificultad para respirar.  Tiene fiebre.  Siente dolor cerca  de la incisin en la pierna. ASEGRESE DE QUE:   Comprende estas instrucciones.  Controlar su enfermedad.  Solicitar ayuda de inmediato si no mejora o si empeora. Document Released: 06/04/2011 Document Revised: 12/15/2011 [facility] Patient Information 2014 [place], [place]. Coronary Artery Bypass Grafting, Care After Refer to this sheet in the next few weeks. These instructions  provide you with information on caring for yourself after your procedure. Your health care provider may also give you more specific instructions. Your treatment has been planned according to current medical practices, but problems sometimes occur. Call your health care provider if you have any problems or questions after your procedure. WHAT TO EXPECT AFTER THE PROCEDURE Recovery from surgery will be different for everyone. Some people feel well after 3 or 4 weeks, while for others it takes longer. After your procedure, it is typical to have the following:  Nausea and a lack of appetite.   Constipation.  Weakness and fatigue.   Depression or irritability.   Pain or discomfort at your incision site. HOME CARE INSTRUCTIONS  Only take over-the-counter or prescription medicines as directed by your health care provider. Take all medicines exactly as directed. Do not stop taking medicines or start any new medicines without first checking with your health care provider.   Take your pulse as directed by your health care provider.  Perform deep breathing as directed by your health care provider. If you were given a device called an incentive spirometer, use it to practice deep breathing several times a day. Support your chest with a pillow or your arms when you take deep breaths or cough.  Keep incision areas clean, dry, and protected. Remove or change any bandages (dressings) only as directed by your health care provider. You may have skin adhesive strips over the incision areas. Do not take the strips off. They will fall off on their own.  Check incision areas daily for any swelling, redness, or drainage.  If incisions were made in your legs, do the following:  Avoid crossing your legs.   Avoid sitting for long periods of time. Change positions every 30 minutes.   Elevate your legs when you are sitting.   Wear compression stockings as directed by your health care provider. These  stockings help keep blood clots from forming in your legs.  Take showers once your health care provider approves. Until then, only take sponge baths. Pat incisions dry. Do not rub incisions with a washcloth or towel. Do not take tub baths or go swimming until your health care provider approves.  Eat foods that are high in fiber, such as raw fruits and vegetables, whole grains, beans, and nuts. Meats should be lean cut. Avoid canned, processed, and fried foods.  Drink enough fluids to keep your urine clear or pale yellow.  Weigh yourself every day. This helps identify if you are retaining fluid that may make your heart and lungs work harder.   Rest and limit activity as directed by your health care provider. You may be instructed to:  Stop any activity at once if you have chest pain, shortness of breath, irregular heartbeats, or dizziness. Get help right away if you have any of these symptoms.  Move around frequently for short periods or take short walks as directed by your health care provider. Increase your activities gradually. You may need physical therapy or cardiac rehabilitation to help strengthen your muscles and build your endurance.  Avoid lifting, pushing, or pulling anything heavier than 10 lb (4.5 kg)  for at least 6 weeks after surgery.  Do not drive until your health care provider approves.  Ask your health care provider when you may return to work and resume sexual activity.  Follow up with your health care provider as directed.  SEEK MEDICAL CARE IF:  You have swelling, redness, increasing pain, or drainage at the site of an incision.   You develop a fever.   You have swelling in your ankles or legs.   You have pain in your legs.   You have weight gain of 2 or more pounds a day.  You are nauseous or vomit.  You have diarrhea. SEEK IMMEDIATE MEDICAL CARE IF:  You have chest pain that goes to your jaw or arms.  You have shortness of breath.   You have  a fast or irregular heartbeat.   You notice a "clicking" in your breastbone (sternum) when you move.   You have numbness or weakness in your arms or legs.  You feel dizzy or lightheaded.  MAKE SURE YOU:  Understand these instructions.  Will watch your condition.  Will get help right away if you are not doing well or get worse. Document Released: 04/11/2005 Document Revised: 05/25/2013 Document Reviewed: 03/01/2013 [facility] Patient Information 2014 [place].

## 2013-10-14 NOTE — Progress Notes (Addendum)
Tel shows Atrial Fib heart rate 140's, patient resting in bed, denies discomfort although she can tell her heart beating faster, husband bedside. BP 94/54, room air sat 96%. Dr. Cyndia Bent made aware and orders received to start amiodarone bolus. Laurie Lambert

## 2013-10-15 LAB — CBC
HCT: 23.5 % — ABNORMAL LOW (ref 36.0–46.0)
Hemoglobin: 7.9 g/dL — ABNORMAL LOW (ref 12.0–15.0)
MCH: 27.7 pg (ref 26.0–34.0)
MCHC: 33.6 g/dL (ref 30.0–36.0)
MCV: 82.5 fL (ref 78.0–100.0)
PLATELETS: 195 10*3/uL (ref 150–400)
RBC: 2.85 MIL/uL — AB (ref 3.87–5.11)
RDW: 16.6 % — ABNORMAL HIGH (ref 11.5–15.5)
WBC: 7.4 10*3/uL (ref 4.0–10.5)

## 2013-10-15 LAB — BASIC METABOLIC PANEL
BUN: 16 mg/dL (ref 6–23)
CHLORIDE: 107 meq/L (ref 96–112)
CO2: 23 mEq/L (ref 19–32)
CREATININE: 0.47 mg/dL — AB (ref 0.50–1.10)
Calcium: 8.7 mg/dL (ref 8.4–10.5)
GFR calc Af Amer: >90 mL/min (ref >90)
GFR calc non Af Amer: >90 mL/min (ref >90)
Glucose, Bld: 107 mg/dL — ABNORMAL HIGH (ref 70–99)
Potassium: 4.5 mEq/L (ref 3.7–5.3)
Sodium: 142 mEq/L (ref 137–147)

## 2013-10-15 LAB — GLUCOSE, CAPILLARY: Glucose-Capillary: 93 mg/dL (ref 70–99)

## 2013-10-15 NOTE — Progress Notes (Signed)
12:10 pm  Patient is ambulating with her husband.  She is using walker.  No difficulty per her husband with walking.  Leverne Humbles RN

## 2013-10-15 NOTE — Progress Notes (Addendum)
       [address]       [place] [address]             [phone]          5 Days Post-Op Procedure(s) (LRB): AORTIC VALVE REPLACEMENT (AVR) (N/A) CORONARY ARTERY BYPASS GRAFTING TIMES THREE USING LEFT INTERNAL MAMMARY ARTERY AND RIGHT LEG GREATER SAPHENOUS VEIN HARVESTED ENDOSCOPICALLY (N/A) INTRAOPERATIVE TRANSESOPHAGEAL ECHOCARDIOGRAM (N/A)  Subjective: Had a lot of upper back and shoulder pain overnight, but this is better after heat and pain Rx.  No other issues this am.   Objective: Vital signs in last 24 hours: Patient Vitals for the past 24 hrs:  BP Temp Temp src Pulse Resp SpO2 Weight  10/15/13 0357 115/69 mmHg 99.5 F (37.5 C) Oral 75 18 97 % 148 lb 13 oz (67.5 kg)  10/14/13 1939 101/58 mmHg 98.6 F (37 C) Oral 79 18 97 % -  10/14/13 1350 108/77 mmHg 98.8 F (37.1 C) Oral 86 18 99 % -  10/14/13 1233 100/59 mmHg - - 83 - - -   Current Weight  10/15/13 148 lb 13 oz (67.5 kg)  Pre op weight 67 kg    Intake/Output from previous day: 01/09 0701 - 01/10 0700 In: 600 [P.O.:600] Out: 300 [Urine:300]    PHYSICAL EXAM:  Heart: RRR Lungs: Clear Wound: Clean and dry Extremities: No significant LE edema    Lab Results: CBC: Recent Labs  10/14/13 0551 10/15/13 0307  WBC 6.5 7.4  HGB 8.1* 7.9*  HCT 23.7* 23.5*  PLT 174 195   BMET:  Recent Labs  10/14/13 0551 10/15/13 0307  NA 141 142  K 3.0* 4.5  CL 101 107  CO2 21 23  GLUCOSE 86 107*  BUN 10 16  CREATININE 0.42* 0.47*  CALCIUM 8.1* 8.7    PT/INR: No results found for this basename: LABPROT, INR,  in the last 72 hours    Assessment/Plan: S/P Procedure(s) (LRB): AORTIC VALVE REPLACEMENT (AVR) (N/A) CORONARY ARTERY BYPASS GRAFTING TIMES THREE USING LEFT INTERNAL MAMMARY ARTERY AND RIGHT LEG GREATER SAPHENOUS VEIN HARVESTED ENDOSCOPICALLY (N/A) INTRAOPERATIVE TRANSESOPHAGEAL ECHOCARDIOGRAM (N/A) CV- No more AF since yesterday.  Continue po Amio, Lopressor. BPs  stable. Expected postop blood loss anemia- H/H down slightly, continue Fe. Back pain- she has a small mobile cyst at upper left back, which her husband states was present prior to surgery.  I think the pain is musculoskeletal, so will continue topical heat and pain Rx prn for now. CRPI, pulm toilet. Possibly home 1-2 days if rhythm remains stable.    LOS: 5 days    COLLINS,GINA H 10/15/2013  Patient seen and examined, agree with above

## 2013-10-16 MED ORDER — MAGNESIUM HYDROXIDE 400 MG/5ML PO SUSP: 30.0000 mL | Freq: Every day | ORAL | Status: DC | PRN

## 2013-10-16 MED ORDER — LACTULOSE 10 GM/15ML PO SOLN
10.0000 g | Freq: Every day | ORAL | Status: DC | PRN
  Filled 2013-10-16: qty 15

## 2013-10-16 NOTE — Progress Notes (Addendum)
       [address]       [place] [address]             [phone]          6 Days Post-Op Procedure(s) (LRB): AORTIC VALVE REPLACEMENT (AVR) (N/A) CORONARY ARTERY BYPASS GRAFTING TIMES THREE USING LEFT INTERNAL MAMMARY ARTERY AND RIGHT LEG GREATER SAPHENOUS VEIN HARVESTED ENDOSCOPICALLY (N/A) INTRAOPERATIVE TRANSESOPHAGEAL ECHOCARDIOGRAM (N/A)  Subjective: C/o "pulling" sensation in chest this am. Back pain improved.  Also has not had a BM in several days.   Objective: Vital signs in last 24 hours: Patient Vitals for the past 24 hrs:  BP Temp Temp src Pulse Resp SpO2 Weight  10/16/13 0617 112/64 mmHg 100 F (37.8 C) Oral 72 17 97 % 149 lb 11.1 oz (67.9 kg)  10/15/13 2051 119/71 mmHg 98.6 F (37 C) Oral 80 17 97 % -  10/15/13 1700 142/78 mmHg - - 73 - - -  10/15/13 1354 116/53 mmHg 98.2 F (36.8 C) Oral 73 18 98 % -  10/15/13 1000 90/54 mmHg - - 76 - - -   Current Weight  10/16/13 149 lb 11.1 oz (67.9 kg)  Pre op weight 67 kg    Intake/Output from previous day: 01/10 0701 - 01/11 0700 In: 480 [P.O.:480] Out: 650 [Urine:650]    PHYSICAL EXAM:  Heart: RRR Lungs: Clear Wound: Clean and dry Extremities: No significant LE edema    Lab Results: CBC: Recent Labs  10/14/13 0551 10/15/13 0307  WBC 6.5 7.4  HGB 8.1* 7.9*  HCT 23.7* 23.5*  PLT 174 195   BMET:  Recent Labs  10/14/13 0551 10/15/13 0307  NA 141 142  K 3.0* 4.5  CL 101 107  CO2 21 23  GLUCOSE 86 107*  BUN 10 16  CREATININE 0.42* 0.47*  CALCIUM 8.1* 8.7    PT/INR: No results found for this basename: LABPROT, INR,  in the last 72 hours    Assessment/Plan: S/P Procedure(s) (LRB): AORTIC VALVE REPLACEMENT (AVR) (N/A) CORONARY ARTERY BYPASS GRAFTING TIMES THREE USING LEFT INTERNAL MAMMARY ARTERY AND RIGHT LEG GREATER SAPHENOUS VEIN HARVESTED ENDOSCOPICALLY (N/A) INTRAOPERATIVE TRANSESOPHAGEAL ECHOCARDIOGRAM (N/A)  CV- Maintaining SR. Continue po Amio, Lopressor.  BPs stable.   Expected postop blood loss anemia- Continue Fe.   Will Rx LOC today.  Pain all seems to be musculoskeletal in origin as she is very tender to touch over her chest wall.  Reassured the patient and her husband.  D/c EPWs, CT sutures and watch.  Home probably in am if she remains stable.     LOS: 6 days    COLLINS,GINA H 10/16/2013  Patient seen and examined, agree with above

## 2013-10-17 MED ORDER — FERROUS GLUCONATE 324 (38 FE) MG PO TABS: 324.0000 mg | ORAL_TABLET | Freq: Every day | ORAL | Status: DC

## 2013-10-17 MED ORDER — TRAMADOL HCL 50 MG PO TABS: 50.0000 mg | ORAL_TABLET | ORAL | Status: DC | PRN

## 2013-10-17 MED ORDER — OXYCODONE HCL 5 MG PO TABS: 5.0000 mg | ORAL_TABLET | ORAL | Status: DC | PRN

## 2013-10-17 MED ORDER — METOPROLOL TARTRATE 25 MG PO TABS: 12.5000 mg | ORAL_TABLET | Freq: Two times a day (BID) | ORAL | Status: DC

## 2013-10-17 MED ORDER — ASPIRIN 325 MG PO TBEC: 325.0000 mg | DELAYED_RELEASE_TABLET | Freq: Every day | ORAL | Status: DC

## 2013-10-17 MED ORDER — AMIODARONE HCL 400 MG PO TABS: 400.0000 mg | ORAL_TABLET | Freq: Two times a day (BID) | ORAL | Status: DC

## 2013-10-17 MED ORDER — LISINOPRIL 2.5 MG PO TABS: 2.5000 mg | ORAL_TABLET | Freq: Every day | ORAL | Status: DC

## 2013-10-17 NOTE — Progress Notes (Signed)
CARDIAC REHAB PHASE I   PRE:  Rate/Rhythm: 68 SR    BP: sitting 104/60    SaO2: 97 RA  MODE:  Ambulation: 550 ft   POST:  Rate/Rhythm: 85 SR    BP: sitting 116/50     SaO2: 96 RA  Pt tolerated well, moving well. Sts pain is improved. C/o dry cough that is annoying her, keeping her from sleeping. Would like RW for home.  [phone]   Josephina Shih [place] CES, ACSM 10/17/2013 10:34 AM

## 2013-10-17 NOTE — Progress Notes (Signed)
Pt discharged per orders. All discharge instructions/orders discussed with patient and her husband at bedside. Pt speaks limited English. Husband speaks [place]. Pt and her husband agree that they understand the follow up appointments, the medications, and signs/symptoms that would require the patient to return to the hospital. Prescriptions were given to the patient's husband. The patient had no sutures to remove. Patient does have steri strips to one incision on her chest and care of that site was discussed with the patient and her husband. Pt states her current pain level is 0/10. Pt able to get dressed with minimal assistance. Patient going home with a walker. I will transport the patient to the front main entrance via wheelchair for discharge.   Soyla Dryer, RN

## 2013-10-17 NOTE — Progress Notes (Addendum)
      [address]       [place] [address]             [phone]      7 Days Post-Op Procedure(s) (LRB): AORTIC VALVE REPLACEMENT (AVR) (N/A) CORONARY ARTERY BYPASS GRAFTING TIMES THREE USING LEFT INTERNAL MAMMARY ARTERY AND RIGHT LEG GREATER SAPHENOUS VEIN HARVESTED ENDOSCOPICALLY (N/A) INTRAOPERATIVE TRANSESOPHAGEAL ECHOCARDIOGRAM (N/A)  Subjective:  Laurie Lambert complains of chest discomfort.  Her husband translates for her, stating her entire chest is sore.  + ambulation, + BM  Objective: Vital signs in last 24 hours: Temp:  [98.3 F (36.8 C)-99.5 F (37.5 C)] 99.2 F (37.3 C) (01/12 0427) Pulse Rate:  [66-80] 66 (01/12 0427) Cardiac Rhythm:  [-] Normal sinus rhythm (01/11 2100) Resp:  [18-20] 20 (01/12 0427) BP: (96-128)/(42-74) 102/60 mmHg (01/12 0427) SpO2:  [96 %-100 %] 96 % (01/12 0427) Weight:  [148 lb 11.2 oz (67.45 kg)] 148 lb 11.2 oz (67.45 kg) (01/12 0427)  Intake/Output from previous day: 01/11 0701 - 01/12 0700 In: 480 [P.O.:480] Out: 1050 [Urine:1050]  General appearance: alert, cooperative and no distress Heart: regular rate and rhythm Lungs: clear to auscultation bilaterally Abdomen: soft, non-tender; bowel sounds normal; no masses,  no organomegaly Extremities: extremities normal, atraumatic, no cyanosis or edema Wound: clean and dry, no ecchymosis present  Lab Results:  Recent Labs  10/15/13 0307  WBC 7.4  HGB 7.9*  HCT 23.5*  PLT 195   BMET:  Recent Labs  10/15/13 0307  NA 142  K 4.5  CL 107  CO2 23  GLUCOSE 107*  BUN 16  CREATININE 0.47*  CALCIUM 8.7    PT/INR: No results found for this basename: LABPROT, INR,  in the last 72 hours ABG    Component Value Date/Time   PHART 7.362 10/10/2013 1958   HCO3 24.4* 10/10/2013 1958   TCO2 22 10/11/2013 1720   ACIDBASEDEF 1.0 10/10/2013 1958   O2SAT 100.0 10/10/2013 1958   CBG (last 3)   Recent Labs  10/14/13 1143 10/15/13 0632  GLUCAP 90 93     Assessment/Plan: S/P Procedure(s) (LRB): AORTIC VALVE REPLACEMENT (AVR) (N/A) CORONARY ARTERY BYPASS GRAFTING TIMES THREE USING LEFT INTERNAL MAMMARY ARTERY AND RIGHT LEG GREATER SAPHENOUS VEIN HARVESTED ENDOSCOPICALLY (N/A) INTRAOPERATIVE TRANSESOPHAGEAL ECHOCARDIOGRAM (N/A)  1. CV- rate and pressure controlled- on Lopressor 2. Pulm- off oxygen, continue IS 3. Renal- volume status at baseline 4. Pain- most likely musculoskeletal and post operative incisional pain.  Patient has not been taking any pain medication since Saturday at noon.  I instructed nursing to administer pain medication this morning to see if any relief is obtained 5. Dispo- patient is medically stable, try to achieve pain control with medication, possibly home this afternoon vs. tomorrow   LOS: 7 days    Laurie Lambert 10/17/2013   Chart reviewed, patient examined, agree with above. She feels better this afternoon and wants to go home.

## 2013-10-24 ENCOUNTER — Telehealth: Payer: Self-pay | Admitting: Cardiovascular Disease

## 2013-10-24 NOTE — Telephone Encounter (Signed)
New Message   Pt called states that she has a Fever. There are also a cut on her leg that has turned yellow and green. Please call back to discuss.

## 2013-10-25 NOTE — Telephone Encounter (Signed)
Returned call to patient's friend Mohammed Kindle no answer.[place].

## 2013-10-25 NOTE — Telephone Encounter (Signed)
New Problem:  Mohammed Kindle, of friend of the pt's, states the pt is c/o a bad cough and the incision in her leg hurts and it appears to be red and green. Mohammed Kindle would like a call back from the nurse.

## 2013-10-26 NOTE — Telephone Encounter (Signed)
Spoke with patient's friend, Mohammed Kindle, pt does not speak [place].

## 2013-10-26 NOTE — Telephone Encounter (Signed)
Spoke with pt's friend Mohammed Kindle, she states saw pt yesterday. And she was running a low grade fever of 100.0 and wanted to  see if she could be seen sooner before the office appointment. Pt's   right next to her knee saphenous incision was red and has some swelling. Also pt's chest incision was hurting when coughing. Mohammed Kindle states pt is doing better today and that she is afebrile . Pt feels that is fine to wait till Monday 10/31/13  When  she has an appointment  With Truitt Merle NP at 10:0:00 AM.

## 2013-10-31 ENCOUNTER — Telehealth: Payer: Self-pay

## 2013-10-31 ENCOUNTER — Ambulatory Visit (INDEPENDENT_AMBULATORY_CARE_PROVIDER_SITE_OTHER): Payer: BC Managed Care – PPO | Admitting: Nurse Practitioner

## 2013-10-31 ENCOUNTER — Encounter: Payer: Self-pay | Admitting: Nurse Practitioner

## 2013-10-31 VITALS — BP 132/70 | HR 79 | Temp 98.3°F | Ht 62.0 in | Wt 149.8 lb

## 2013-10-31 DIAGNOSIS — Z954 Presence of other heart-valve replacement: Secondary | ICD-10-CM

## 2013-10-31 DIAGNOSIS — Z951 Presence of aortocoronary bypass graft: Secondary | ICD-10-CM

## 2013-10-31 DIAGNOSIS — Z952 Presence of prosthetic heart valve: Secondary | ICD-10-CM

## 2013-10-31 DIAGNOSIS — I4891 Unspecified atrial fibrillation: Secondary | ICD-10-CM

## 2013-10-31 DIAGNOSIS — I480 Paroxysmal atrial fibrillation: Secondary | ICD-10-CM

## 2013-10-31 LAB — BASIC METABOLIC PANEL
BUN: 14 mg/dL (ref 6–23)
CO2: 25 mEq/L (ref 19–32)
Calcium: 9 mg/dL (ref 8.4–10.5)
Chloride: 107 mEq/L (ref 96–112)
Creatinine, Ser: 0.5 mg/dL (ref 0.4–1.2)
GFR: 129.52 mL/min (ref >60.00)
Glucose, Bld: 89 mg/dL (ref 70–99)
Potassium: 3.6 mEq/L (ref 3.5–5.1)
Sodium: 139 mEq/L (ref 135–145)

## 2013-10-31 LAB — CBC WITH DIFFERENTIAL/PLATELET
Basophils Absolute: 0 10*3/uL (ref 0.0–0.1)
Basophils Relative: 0.4 % (ref 0.0–3.0)
Eosinophils Absolute: 0.1 10*3/uL (ref 0.0–0.7)
Eosinophils Relative: 1.2 % (ref 0.0–5.0)
HCT: 28.3 % — ABNORMAL LOW (ref 36.0–46.0)
Hemoglobin: 9.2 g/dL — ABNORMAL LOW (ref 12.0–15.0)
Lymphocytes Relative: 27.2 % (ref 12.0–46.0)
Lymphs Abs: 2 10*3/uL (ref 0.7–4.0)
MCHC: 32.6 g/dL (ref 30.0–36.0)
MCV: 80.5 fl (ref 78.0–100.0)
Monocytes Absolute: 0.4 10*3/uL (ref 0.1–1.0)
Monocytes Relative: 5.4 % (ref 3.0–12.0)
Neutro Abs: 4.8 10*3/uL (ref 1.4–7.7)
Neutrophils Relative %: 65.8 % (ref 43.0–77.0)
Platelets: 513 10*3/uL — ABNORMAL HIGH (ref 150.0–400.0)
RBC: 3.52 Mil/uL — ABNORMAL LOW (ref 3.87–5.11)
RDW: 16.5 % — ABNORMAL HIGH (ref 11.5–14.6)
WBC: 7.3 10*3/uL (ref 4.5–10.5)

## 2013-10-31 MED ORDER — AMIODARONE HCL 400 MG PO TABS: 200.0000 mg | ORAL_TABLET | Freq: Two times a day (BID) | ORAL | Status: DC

## 2013-10-31 NOTE — Telephone Encounter (Signed)
Message copied by Lamar Laundry on Mon Oct 31, 2013  4:37 PM ------      Message from: Burtis Junes      Created: Mon Oct 31, 2013  3:11 PM       Ok to report. Labs look ok. Blood count improving. ------

## 2013-10-31 NOTE — Telephone Encounter (Signed)
lmom.Ok to report. Labs look ok. Blood count improving.

## 2013-10-31 NOTE — Progress Notes (Signed)
Laurie Lambert Date of Birth: 20-Oct-1955 Medical Record #[id number]  History of Present Illness: Ms. Laurie Lambert is seen back today for a post hospital visit. Seen for Dr. Angelena Form. She is a 58 year old [place] woman who does not speak [place]. She had had HTN, HLD, and a heart murmur - found to have severe AS - work up included cath - ended up having AVR with CABG per Dr. Cyndia Bent. She has had CABG x3 with LIMA to LAD, SVG to AM and PD and a #57mm Edwards pericardial valve. Her course was complicated by PAF and she was placed on amiodarone.   Comes back today. Here with the interpreter and her son. She is doing well. No chest pain. Not short of breath. Did have some fever early on after discharge - none now. Tells me she had the flu - did not go and seek care. Some cough with very little phlegm. Using her IS still. Using cough medicine as well. No palpitations. Not short of breath. Not dizzy or lightheaded. Appetite ok. Bowels ok.   Current Outpatient Prescriptions  Medication Sig Dispense Refill  . amiodarone (PACERONE) 400 MG tablet Take 1 tablet (400 mg total) by mouth 2 (two) times daily. For 1 week, then decrease to 400 mg once daily  60 tablet  3  . aspirin EC 325 MG EC tablet Take 1 tablet (325 mg total) by mouth daily.  30 tablet  0  . ferrous gluconate (FERGON) 324 MG tablet Take 1 tablet (324 mg total) by mouth daily with breakfast.  30 tablet  3  . lisinopril (PRINIVIL,ZESTRIL) 2.5 MG tablet Take 1 tablet (2.5 mg total) by mouth daily.  30 tablet  3  . lovastatin (MEVACOR) 20 MG tablet Take 20 mg by mouth at bedtime.      . metoprolol tartrate (LOPRESSOR) 25 MG tablet Take 0.5 tablets (12.5 mg total) by mouth 2 (two) times daily.  30 tablet  3  . oxyCODONE (OXY IR/ROXICODONE) 5 MG immediate release tablet Take 1-2 tablets (5-10 mg total) by mouth every 3 (three) hours as needed for severe pain.  30 tablet  0  . traMADol (ULTRAM) 50 MG tablet Take 1-2 tablets (50-100 mg total) by mouth  every 4 (four) hours as needed for moderate pain.  30 tablet  0   No current facility-administered medications for this visit.    No Known Allergies  Past Medical History  Diagnosis Date  . Hypertension   . Edema leg   . Heart murmur   . Hyperlipemia   . Coronary artery disease     Past Surgical History  Procedure Laterality Date  . Breast cyst removal    . Ankle surgery Right     25 years ago  . Tee without cardioversion N/A 08/12/2013    Procedure: TRANSESOPHAGEAL ECHOCARDIOGRAM (TEE);  Surgeon: Dorothy Spark, MD;  Location: [facility];  Service: Cardiovascular;  Laterality: N/A;  Spanish Interpreter needed  . Vaginal delivery      x4  . Tubal ligation    . Cardiac catheterization    . Eye surgery      lasik  . Uterine fibroid surgery  2010  . Aortic valve replacement N/A 10/10/2013    Procedure: AORTIC VALVE REPLACEMENT (AVR);  Surgeon: Gaye Pollack, MD;  Location: [facility];  Service: Open Heart Surgery;  Laterality: N/A;  . Coronary artery bypass graft N/A 10/10/2013    Procedure: CORONARY ARTERY BYPASS GRAFTING TIMES THREE USING LEFT INTERNAL MAMMARY  ARTERY AND RIGHT LEG GREATER SAPHENOUS VEIN HARVESTED ENDOSCOPICALLY;  Surgeon: Gaye Pollack, MD;  Location: [facility];  Service: Open Heart Surgery;  Laterality: N/A;  . Intraoperative transesophageal echocardiogram N/A 10/10/2013    Procedure: INTRAOPERATIVE TRANSESOPHAGEAL ECHOCARDIOGRAM;  Surgeon: Gaye Pollack, MD;  Location: [facility];  Service: Open Heart Surgery;  Laterality: N/A;    History  Smoking status  . Never Smoker   Smokeless tobacco  . Never Used    History  Alcohol Use No    Family History  Problem Relation Age of Onset  . CAD Neg Hx     Review of Systems: The review of systems is per the HPI.  All other systems were reviewed and are negative.  Physical Exam: BP 132/70  Pulse 79  Temp(Src) 98.3 F (36.8 C)  Ht 5\' 2"  (1.575 m)  Wt 149 lb 12.8 oz (67.949 kg)  BMI 27.39 kg/m2  SpO2  97% Patient is very pleasant and in no acute distress. Skin is warm and dry. Color is normal.  HEENT is unremarkable. Normocephalic/atraumatic. PERRL. Sclera are nonicteric. Neck is supple. No masses. No JVD. Lungs are clear. Cardiac exam shows a regular rate and rhythm. Soft outflow murmur noted. Sternum looks ok. Abdomen is soft. Extremities full but without significant edema. Gait and ROM are intact. No gross neurologic deficits noted.  LABORATORY DATA:  EKG today shows sinus rhythm.   Lab Results  Component Value Date   WBC 7.4 10/15/2013   HGB 7.9* 10/15/2013   HCT 23.5* 10/15/2013   PLT 195 10/15/2013   GLUCOSE 107* 10/15/2013   ALT 14 10/07/2013   AST 21 10/07/2013   NA 142 10/15/2013   K 4.5 10/15/2013   CL 107 10/15/2013   CREATININE 0.47* 10/15/2013   BUN 16 10/15/2013   CO2 23 10/15/2013   INR 1.69* 10/10/2013   HGBA1C 6.0* 10/07/2013    Assessment / Plan:  1. Post AVR/CABG - she seems to be doing well. We have discussed her activities/weight restrictions etc. Will set her up for repeat echo. Check follow up labs today. See her back in a month with fasting labs. Reminded about SBE.   2. HTN - BP ok on current regimen  3. HLD - on statin  4. PAF - remains in sinus - I have cut her amiodarone to 200 mg a day - hope to discontinue altogether on return.   Patient is agreeable to this plan and will call if any problems develop in the interim.   Burtis Junes, RN, [place] [address] [place], [place]  [address] [phone]   Patient is agreeable to this plan and will call if any problems develop in the interim.   Burtis Junes, RN, [place] [address] [place], [place]  [address] [phone]

## 2013-10-31 NOTE — Patient Instructions (Addendum)
We need to check labs today  Stay on your current medicines but we are cutting your Amiodarone back to just a half a pill daily (200 mg)  We are going to arrange for a repeat echocardiogram  I will see you in a month with fasting labs - do not eat that morning  Call the [place] office at [phone] if you have any questions, problems or concerns.

## 2013-11-01 ENCOUNTER — Encounter: Payer: BC Managed Care – PPO | Admitting: Nurse Practitioner

## 2013-11-07 ENCOUNTER — Other Ambulatory Visit: Payer: Self-pay | Admitting: *Deleted

## 2013-11-07 DIAGNOSIS — I2510 Atherosclerotic heart disease of native coronary artery without angina pectoris: Secondary | ICD-10-CM

## 2013-11-09 ENCOUNTER — Ambulatory Visit
Admission: RE | Admit: 2013-11-09 | Discharge: 2013-11-09 | Disposition: A | Discharge status: Home / Self Care | Payer: BC Managed Care – PPO | Source: Ambulatory Visit | Attending: Surgery | Admitting: Surgery

## 2013-11-09 ENCOUNTER — Encounter: Payer: Self-pay | Admitting: Surgery

## 2013-11-09 ENCOUNTER — Ambulatory Visit (INDEPENDENT_AMBULATORY_CARE_PROVIDER_SITE_OTHER): Payer: Self-pay | Admitting: Surgery

## 2013-11-09 VITALS — BP 174/78 | HR 79 | Resp 20 | Ht 62.0 in | Wt 149.0 lb

## 2013-11-09 DIAGNOSIS — I350 Nonrheumatic aortic (valve) stenosis: Secondary | ICD-10-CM

## 2013-11-09 DIAGNOSIS — I2510 Atherosclerotic heart disease of native coronary artery without angina pectoris: Secondary | ICD-10-CM

## 2013-11-09 DIAGNOSIS — Z952 Presence of prosthetic heart valve: Secondary | ICD-10-CM

## 2013-11-09 DIAGNOSIS — Z951 Presence of aortocoronary bypass graft: Secondary | ICD-10-CM

## 2013-11-09 DIAGNOSIS — I359 Nonrheumatic aortic valve disorder, unspecified: Secondary | ICD-10-CM

## 2013-11-09 DIAGNOSIS — Z954 Presence of other heart-valve replacement: Secondary | ICD-10-CM

## 2013-11-09 NOTE — Progress Notes (Signed)
HPI:  Patient returns for routine postoperative follow-up having undergone CABG x 3 and AVR using a pericardial valve on 10/10/2013. The patient's early postoperative recovery while in the hospital was notable for development of postop atrial fibrillation converted with amiodarone.  She is here with her family and an interpreter today. Since hospital discharge the patient reports that she has had a persistent, non-productive, tickling cough. She has been walking without chest pain or shortness of breath. She also reports some decrease in her appetite and a bad taste in her mouth.   Current Outpatient Prescriptions  Medication Sig Dispense Refill  . amiodarone (PACERONE) 400 MG tablet Take 0.5 tablets (200 mg total) by mouth 2 (two) times daily. For 1 week, then decrease to 400 mg once daily  60 tablet  3  . aspirin EC 325 MG EC tablet Take 1 tablet (325 mg total) by mouth daily.  30 tablet  0  . ferrous gluconate (FERGON) 324 MG tablet Take 1 tablet (324 mg total) by mouth daily with breakfast.  30 tablet  3  . lisinopril (PRINIVIL,ZESTRIL) 2.5 MG tablet Take 1 tablet (2.5 mg total) by mouth daily.  30 tablet  3  . lovastatin (MEVACOR) 20 MG tablet Take 20 mg by mouth at bedtime.      . metoprolol tartrate (LOPRESSOR) 25 MG tablet Take 0.5 tablets (12.5 mg total) by mouth 2 (two) times daily.  30 tablet  3  . oxyCODONE (OXY IR/ROXICODONE) 5 MG immediate release tablet Take 1-2 tablets (5-10 mg total) by mouth every 3 (three) hours as needed for severe pain.  30 tablet  0  . traMADol (ULTRAM) 50 MG tablet Take 1-2 tablets (50-100 mg total) by mouth every 4 (four) hours as needed for moderate pain.  30 tablet  0   No current facility-administered medications for this visit.    Physical Exam: BP 174/78  Pulse 79  Resp 20  Ht 5\' 2"  (1.575 m)  Wt 149 lb (67.586 kg)  BMI 27.25 kg/m2  SpO2 97%  She looks well Lungs are clear Cardiac exam shows a regular rate and rhythm with normal  heart sounds. The chest incision is healing well and the sternum is stable. The leg incisions are healing well and there is no significant edema.  Diagnostic Tests:  CLINICAL DATA: Status post CABG.  EXAM:  CHEST 2 VIEW  COMPARISON: 10/14/2013  FINDINGS:  The cardio pericardial silhouette is enlarged. Stable to slightly  increased left pleural effusion. Atelectasis at the left base seen  on the previous study has decreased in the interval. The right base  atelectasis seen previously has resolved. Interval decrease in  pulmonary vascular congestion. Nodule overlying the right upper lung  is most consistent with a calcified granuloma given its density  relative to its small size. Bones are diffusely demineralized.  IMPRESSION:  Stable 2 possible slight increase in left pleural effusion.  Interval decrease in bibasilar atelectasis and associated decrease  in vascular congestion.  Electronically Signed  By: Misty Stanley M.D.  On: 11/09/2013 12:05         Impression:  Overall I think she is doing well. I encouraged her to continue walking.  I told him he could drive his car but should not lift anything heavier than 10 lbs for three months postop. I suspect her cough may be due to lisinopril which is a new medication for her. I asked her to stop this to see if the cough resolves.  I asked her to increase her metoprolol to 25 mg twice daily. I told her she can discontinue the [facility] which is probably affecting her appetite and taste. She seems to be maintaining sinus rhythm so I stopped the amiodarone.    Plan:  She will follow-up with cardiology and will return to see me if she develops any problems with her incisions.

## 2013-11-25 ENCOUNTER — Ambulatory Visit (HOSPITAL_COMMUNITY): Payer: BC Managed Care – PPO | Attending: Cardiology | Admitting: Radiology

## 2013-11-25 DIAGNOSIS — I359 Nonrheumatic aortic valve disorder, unspecified: Secondary | ICD-10-CM | POA: Insufficient documentation

## 2013-11-25 DIAGNOSIS — I2510 Atherosclerotic heart disease of native coronary artery without angina pectoris: Secondary | ICD-10-CM

## 2013-11-25 DIAGNOSIS — I379 Nonrheumatic pulmonary valve disorder, unspecified: Secondary | ICD-10-CM | POA: Insufficient documentation

## 2013-11-25 DIAGNOSIS — I480 Paroxysmal atrial fibrillation: Secondary | ICD-10-CM

## 2013-11-25 DIAGNOSIS — I059 Rheumatic mitral valve disease, unspecified: Secondary | ICD-10-CM | POA: Insufficient documentation

## 2013-11-25 DIAGNOSIS — I079 Rheumatic tricuspid valve disease, unspecified: Secondary | ICD-10-CM | POA: Insufficient documentation

## 2013-11-25 DIAGNOSIS — I4891 Unspecified atrial fibrillation: Secondary | ICD-10-CM | POA: Insufficient documentation

## 2013-11-25 DIAGNOSIS — Z952 Presence of prosthetic heart valve: Secondary | ICD-10-CM

## 2013-11-25 DIAGNOSIS — Z951 Presence of aortocoronary bypass graft: Secondary | ICD-10-CM | POA: Insufficient documentation

## 2013-11-25 DIAGNOSIS — I10 Essential (primary) hypertension: Secondary | ICD-10-CM | POA: Insufficient documentation

## 2013-11-25 DIAGNOSIS — E785 Hyperlipidemia, unspecified: Secondary | ICD-10-CM | POA: Insufficient documentation

## 2013-11-25 NOTE — Progress Notes (Signed)
Echocardiogram Performed. 

## 2013-12-05 ENCOUNTER — Other Ambulatory Visit: Payer: BC Managed Care – PPO

## 2013-12-05 ENCOUNTER — Ambulatory Visit (INDEPENDENT_AMBULATORY_CARE_PROVIDER_SITE_OTHER): Payer: BC Managed Care – PPO | Admitting: Nurse Practitioner

## 2013-12-05 ENCOUNTER — Encounter: Payer: Self-pay | Admitting: Nurse Practitioner

## 2013-12-05 VITALS — BP 170/82 | HR 59 | Ht 59.0 in | Wt 155.0 lb

## 2013-12-05 DIAGNOSIS — R079 Chest pain, unspecified: Secondary | ICD-10-CM

## 2013-12-05 DIAGNOSIS — I4891 Unspecified atrial fibrillation: Secondary | ICD-10-CM

## 2013-12-05 DIAGNOSIS — I480 Paroxysmal atrial fibrillation: Secondary | ICD-10-CM

## 2013-12-05 DIAGNOSIS — Z951 Presence of aortocoronary bypass graft: Secondary | ICD-10-CM

## 2013-12-05 DIAGNOSIS — Z954 Presence of other heart-valve replacement: Secondary | ICD-10-CM

## 2013-12-05 DIAGNOSIS — Z952 Presence of prosthetic heart valve: Secondary | ICD-10-CM

## 2013-12-05 LAB — CBC
HCT: 34.7 % — ABNORMAL LOW (ref 36.0–46.0)
Hemoglobin: 11.1 g/dL — ABNORMAL LOW (ref 12.0–15.0)
MCHC: 31.9 g/dL (ref 30.0–36.0)
MCV: 77.9 fl — ABNORMAL LOW (ref 78.0–100.0)
Platelets: 455 10*3/uL — ABNORMAL HIGH (ref 150.0–400.0)
RBC: 4.46 Mil/uL (ref 3.87–5.11)
RDW: 16.9 % — ABNORMAL HIGH (ref 11.5–14.6)
WBC: 5.6 10*3/uL (ref 4.5–10.5)

## 2013-12-05 LAB — LIPID PANEL
Cholesterol: 170 mg/dL (ref 0–200)
HDL: 49.4 mg/dL (ref >39.00)
LDL Cholesterol: 103 mg/dL — ABNORMAL HIGH (ref 0–99)
Total CHOL/HDL Ratio: 3
Triglycerides: 88 mg/dL (ref 0.0–149.0)
VLDL: 17.6 mg/dL (ref 0.0–40.0)

## 2013-12-05 LAB — HEPATIC FUNCTION PANEL
ALT: 30 U/L (ref 0–35)
AST: 28 U/L (ref 0–37)
Albumin: 3.5 g/dL (ref 3.5–5.2)
Alkaline Phosphatase: 101 U/L (ref 39–117)
Bilirubin, Direct: 0 mg/dL (ref 0.0–0.3)
Total Bilirubin: 0.4 mg/dL (ref 0.3–1.2)
Total Protein: 7.9 g/dL (ref 6.0–8.3)

## 2013-12-05 LAB — BASIC METABOLIC PANEL
BUN: 19 mg/dL (ref 6–23)
CO2: 23 mEq/L (ref 19–32)
Calcium: 9.1 mg/dL (ref 8.4–10.5)
Chloride: 105 mEq/L (ref 96–112)
Creatinine, Ser: 0.6 mg/dL (ref 0.4–1.2)
GFR: 107.69 mL/min (ref >60.00)
Glucose, Bld: 83 mg/dL (ref 70–99)
Potassium: 3.7 mEq/L (ref 3.5–5.1)
Sodium: 138 mEq/L (ref 135–145)

## 2013-12-05 MED ORDER — LOSARTAN POTASSIUM 50 MG PO TABS: 50.0000 mg | ORAL_TABLET | Freq: Every day | ORAL | Status: DC

## 2013-12-05 MED ORDER — LOVASTATIN 20 MG PO TABS: 20.0000 mg | ORAL_TABLET | Freq: Every day | ORAL | Status: DC

## 2013-12-05 NOTE — Progress Notes (Signed)
Laurie Lambert Date of Birth: 04/05/1956 Medical Record #[id number]  History of Present Illness: Laurie Lambert is seen back today for a follow up visit. Seen for Dr. Angelena Lambert. She is a 58 year old [place] woman who does not speak [place]. She had had HTN, HLD, and a heart murmur - found to have severe AS - work up included cath - ended up having AVR with CABG per Dr. Cyndia Lambert. She has had CABG x3 with LIMA to LAD, SVG to AM and PD and a #36mm Edwards pericardial valve. Her course was complicated by PAF and she was placed on amiodarone.   Seen last month for her post op visit - was doing ok. Amiodarone was cut back. Echo repeated.  Seen back by Dr. Cyndia Lambert - concern for ACE cough - her lisinopril was stopped. Amiodarone stopped as well.  Comes back today. Here with the interpreter. Doing ok. No real issue but notes her BP running high. Says her cough has resolved since stopping the ACE. Not lightheaded or dizzy. Not short of breath. For fasting labs today.    Current Outpatient Prescriptions  Medication Sig Dispense Refill  . aspirin EC 325 MG EC tablet Take 1 tablet (325 mg total) by mouth daily.  30 tablet  0  . lovastatin (MEVACOR) 20 MG tablet Take 1 tablet (20 mg total) by mouth at bedtime.  90 tablet  3  . metoprolol tartrate (LOPRESSOR) 25 MG tablet Take 0.5 tablets (12.5 mg total) by mouth 2 (two) times daily.  30 tablet  3  . losartan (COZAAR) 50 MG tablet Take 1 tablet (50 mg total) by mouth daily.  30 tablet  6   No current facility-administered medications for this visit.    Allergies  Allergen Reactions  . Ace Inhibitors     cough    Past Medical History  Diagnosis Date  . Hypertension   . Edema leg   . Heart murmur   . Hyperlipemia   . Coronary artery disease     Past Surgical History  Procedure Laterality Date  . Breast cyst removal    . Ankle surgery Right     25 years ago  . Tee without cardioversion N/A 08/12/2013    Procedure: TRANSESOPHAGEAL ECHOCARDIOGRAM  (TEE);  Surgeon: Laurie Spark, MD;  Location: [facility];  Service: Cardiovascular;  Laterality: N/A;  Spanish Interpreter needed  . Vaginal delivery      x4  . Tubal ligation    . Cardiac catheterization    . Eye surgery      lasik  . Uterine fibroid surgery  2010  . Aortic valve replacement N/A 10/10/2013    Procedure: AORTIC VALVE REPLACEMENT (AVR);  Surgeon: Laurie Pollack, MD;  Location: [facility];  Service: Open Heart Surgery;  Laterality: N/A;  . Coronary artery bypass graft N/A 10/10/2013    Procedure: CORONARY ARTERY BYPASS GRAFTING TIMES THREE USING LEFT INTERNAL MAMMARY ARTERY AND RIGHT LEG GREATER SAPHENOUS VEIN HARVESTED ENDOSCOPICALLY;  Surgeon: Laurie Pollack, MD;  Location: [facility];  Service: Open Heart Surgery;  Laterality: N/A;  . Intraoperative transesophageal echocardiogram N/A 10/10/2013    Procedure: INTRAOPERATIVE TRANSESOPHAGEAL ECHOCARDIOGRAM;  Surgeon: Laurie Pollack, MD;  [facility];  Service: Open Heart Surgery;  Laterality: N/A;    History  Smoking status  . Never Smoker   Smokeless tobacco  . Never Used    History  Alcohol Use No    Family History  Problem Relation Age of Onset  .  CAD Neg Hx     Review of Systems: The review of systems is per the HPI.  All other systems were reviewed and are negative.  Physical Exam: BP 170/82  Pulse 59  Ht 4\' 11"  (1.499 m)  Wt 155 lb (70.308 kg)  BMI 31.29 kg/m2 Patient is very pleasant and in no acute distress. Skin is warm and dry. Color is normal.  HEENT is unremarkable. Normocephalic/atraumatic. PERRL. Sclera are nonicteric. Neck is supple. No masses. No JVD. Lungs are clear. Cardiac exam shows a regular rate and rhythm. She does have an outflow murmur. Abdomen is soft. Extremities are without edema. Gait and ROM are intact. No gross neurologic deficits noted.  LABORATORY DATA:  EKG today shows sinus bradycardia.   Lab Results  Component Value Date   WBC 7.3 10/31/2013   HGB 9.2* 10/31/2013   HCT 28.3*  10/31/2013   PLT 513.0* 10/31/2013   GLUCOSE 89 10/31/2013   ALT 14 10/07/2013   AST 21 10/07/2013   NA 139 10/31/2013   K 3.6 10/31/2013   CL 107 10/31/2013   CREATININE 0.5 10/31/2013   BUN 14 10/31/2013   CO2 25 10/31/2013   INR 1.69* 10/10/2013   HGBA1C 6.0* 10/07/2013   Echo Study Conclusions from November 25, 2013  - Left ventricle: The cavity size was normal. There was mild focal basal hypertrophy of the septum. Systolic function was normal. The estimated ejection fraction was in the range of 60% to 65%. Wall motion was normal; there were no regional wall motion abnormalities. Features are consistent with a pseudonormal left ventricular filling pattern, with concomitant abnormal relaxation and increased filling pressure (grade 2 diastolic dysfunction). - Aortic valve: An Edwards Tekna mechanical prosthesis was present. The sewing ring appeared normal. Valve area: 1.29cm^2(VTI). Valve area: 1.2cm^2 (Vmax). - Mitral valve: Mild regurgitation.    Assessment / Plan:  1. Post AVR/CABG - she continues to do well. Follow up Echo looks good.   2. HTN - BP back up - not on her ACE anymore due to cough - I will try her on Losartan 50 mg a day. [place] lab today. See her back in a month.  3. HLD - on statin - rechecking fasting labs today - I have refilled her Mevacor today.   4. PAF - remains in sinus - now off of her Amiodarone.   I will see her back in a month. Check fasting labs today.   Patient is agreeable to this plan and will call if any problems develop in the interim.   Laurie Junes, RN, [place]  [address] [place]  [place], [place] [address]  [phone]

## 2013-12-05 NOTE — Patient Instructions (Addendum)
We will check labs today  Stay on your current medicines but I am adding Losartan 50 mg a day  I will see you in a month to recheck your blood pressure   See Dr. Angelena Form 3 months after that visit  Call the [place] office at [phone] if you have any questions, problems or concerns.

## 2013-12-21 ENCOUNTER — Encounter: Payer: Self-pay | Admitting: Nurse Practitioner

## 2014-01-05 ENCOUNTER — Ambulatory Visit (INDEPENDENT_AMBULATORY_CARE_PROVIDER_SITE_OTHER): Payer: BC Managed Care – PPO | Admitting: Nurse Practitioner

## 2014-01-05 VITALS — BP 130/70 | HR 78 | Ht 59.0 in | Wt 158.0 lb

## 2014-01-05 DIAGNOSIS — Z952 Presence of prosthetic heart valve: Secondary | ICD-10-CM

## 2014-01-05 DIAGNOSIS — Z951 Presence of aortocoronary bypass graft: Secondary | ICD-10-CM

## 2014-01-05 DIAGNOSIS — I10 Essential (primary) hypertension: Secondary | ICD-10-CM

## 2014-01-05 DIAGNOSIS — Z954 Presence of other heart-valve replacement: Secondary | ICD-10-CM

## 2014-01-05 MED ORDER — METOPROLOL TARTRATE 25 MG PO TABS
12.5000 mg | ORAL_TABLET | Freq: Two times a day (BID) | ORAL | Status: DC
Start: 2014-01-05 — End: 2014-07-21

## 2014-01-05 NOTE — Patient Instructions (Addendum)
Stay on your current medicines  I will see you in 4 months  I have refilled the metoprolol today  You may return to work - no lifting over 30 pounds at this time - can add 10 pounds for each following month   Call the [place] office at [phone] if you have any questions, problems or concerns.

## 2014-01-05 NOTE — Progress Notes (Signed)
Laurie Lambert Date of Birth: 1956/04/16 Medical Record #[id number]  History of Present Illness: Laurie Lambert is seen back today for a follow up visit. This is a one month check. Seen for Dr. Angelena Form. She is a 58 year old [place] woman who does not speak [place]. She had had HTN, HLD, and a heart murmur - found to have severe AS - work up included cath - ended up having AVR with CABG per Dr. Cyndia Bent. She has had CABG x3 with LIMA to LAD, SVG to AM and PD and a #78mm Edwards pericardial valve. Her course was complicated by PAF and she was placed on amiodarone.   Seen back a couple of times since her surgery. Off her ACE due to cough. Off amiodarone. Follow up echo ok. Was doing well except for elevated BP. I placed her on ARB at her last visit.  Comes back today. Here with the interpreter. Off the ARB - says that made her BP go too high. She is 3 months post op. Wants to return to work - actually worked in [employer] prior to surgery. She is doing well. Feels good with no complaint.   Current Outpatient Prescriptions  Medication Sig Dispense Refill  . aspirin EC 325 MG EC tablet Take 1 tablet (325 mg total) by mouth daily.  30 tablet  0  . lovastatin (MEVACOR) 20 MG tablet Take 1 tablet (20 mg total) by mouth at bedtime.  90 tablet  3  . metoprolol tartrate (LOPRESSOR) 25 MG tablet Take 0.5 tablets (12.5 mg total) by mouth 2 (two) times daily.  30 tablet  3   No current facility-administered medications for this visit.    Allergies  Allergen Reactions  . Ace Inhibitors     cough    Past Medical History  Diagnosis Date  . Hypertension   . Edema leg   . Heart murmur   . Hyperlipemia   . Coronary artery disease     Past Surgical History  Procedure Laterality Date  . Breast cyst removal    . Ankle surgery Right     25 years ago  . Tee without cardioversion N/A 08/12/2013    Procedure: TRANSESOPHAGEAL ECHOCARDIOGRAM (TEE);  Surgeon: Dorothy Spark, MD;  Location: [facility];  Service: Cardiovascular;  Laterality: N/A;  Spanish Interpreter needed  . Vaginal delivery      x4  . Tubal ligation    . Cardiac catheterization    . Eye surgery      lasik  . Uterine fibroid surgery  2010  . Aortic valve replacement N/A 10/10/2013    Procedure: AORTIC VALVE REPLACEMENT (AVR);  Surgeon: Gaye Pollack, MD;  Location: [facility];  Service: Open Heart Surgery;  Laterality: N/A;  . Coronary artery bypass graft N/A 10/10/2013    Procedure: CORONARY ARTERY BYPASS GRAFTING TIMES THREE USING LEFT INTERNAL MAMMARY ARTERY AND RIGHT LEG GREATER SAPHENOUS VEIN HARVESTED ENDOSCOPICALLY;  Surgeon: Gaye Pollack, MD;  Location: [facility];  Service: Open Heart Surgery;  Laterality: N/A;  . Intraoperative transesophageal echocardiogram N/A 10/10/2013    Procedure: INTRAOPERATIVE TRANSESOPHAGEAL ECHOCARDIOGRAM;  Surgeon: Gaye Pollack, MD;  Location: [facility] OR;  Service: Open Heart Surgery;  Laterality: N/A;    History  Smoking status  . Never Smoker   Smokeless tobacco  . Never Used    History  Alcohol Use No    Family History  Problem Relation Age of Onset  . CAD Neg Hx     Review  of Systems: The review of systems is per the HPI.  All other systems were reviewed and are negative.  Physical Exam: BP 130/70  Pulse 78  Ht 4\' 11"  (1.499 m)  Wt 158 lb (71.668 kg)  BMI 31.89 kg/m2  SpO2 98% Patient is very pleasant and in no acute distress. Skin is warm and dry. Color is normal.  HEENT is unremarkable. Normocephalic/atraumatic. PERRL. Sclera are nonicteric. Neck is supple. No masses. No JVD. Lungs are clear. Cardiac exam shows a regular rate and rhythm. Outflow murmur noted. Abdomen is soft. Extremities are without edema. Gait and ROM are intact. No gross neurologic deficits noted.  LABORATORY DATA:   Lab Results  Component Value Date   WBC 5.6 12/05/2013   HGB 11.1* 12/05/2013   HCT 34.7* 12/05/2013   PLT 455.0* 12/05/2013   GLUCOSE 83 12/05/2013   CHOL 170 12/05/2013   TRIG 88.0  12/05/2013   HDL 49.40 12/05/2013   LDLCALC 103* 12/05/2013   ALT 30 12/05/2013   AST 28 12/05/2013   NA 138 12/05/2013   K 3.7 12/05/2013   CL 105 12/05/2013   CREATININE 0.6 12/05/2013   BUN 19 12/05/2013   CO2 23 12/05/2013   INR 1.69* 10/10/2013   HGBA1C 6.0* 10/07/2013   Echo Study Conclusions from November 25, 2013  - Left ventricle: The cavity size was normal. There was mild focal basal hypertrophy of the septum. Systolic function was normal. The estimated ejection fraction was in the range of 60% to 65%. Wall motion was normal; there were no regional wall motion abnormalities. Features are consistent with a pseudonormal left ventricular filling pattern, with concomitant abnormal relaxation and increased filling pressure (grade 2 diastolic dysfunction). - Aortic valve: An Edwards Tekna mechanical prosthesis was present. The sewing ring appeared normal. Valve area: 1.29cm^2(VTI). Valve area: 1.2cm^2 (Vmax). - Mitral valve: Mild regurgitation.  Assessment / Plan:  1. Post AVR/CABG - she continues to do well. Follow up Echo looked good.   2. HTN - did not tolerate the ARB - back on her diuretic - BP looks great. Encouraged salt restriction  3. HLD - on statin   4. Post op PAF - remains in sinus   See back in 4 months. May return to work but reminded of lifting restrictions - nothing over 30 pounds - she says this is not a probably. She is doing well.   Patient is agreeable to this plan and will call if any problems develop in the interim.   Burtis Junes, RN, [place] [address] [place], [place]  [address] [phone]

## 2014-01-06 ENCOUNTER — Ambulatory Visit: Payer: BC Managed Care – PPO | Admitting: Nurse Practitioner

## 2014-02-17 ENCOUNTER — Ambulatory Visit: Payer: BC Managed Care – PPO | Admitting: Cardiovascular Disease

## 2014-05-12 ENCOUNTER — Ambulatory Visit: Payer: BC Managed Care – PPO | Admitting: Nurse Practitioner

## 2014-07-21 ENCOUNTER — Encounter: Payer: Self-pay | Admitting: Physician Assistant

## 2014-07-21 ENCOUNTER — Ambulatory Visit (INDEPENDENT_AMBULATORY_CARE_PROVIDER_SITE_OTHER): Payer: BC Managed Care – PPO | Admitting: Physician Assistant

## 2014-07-21 VITALS — BP 150/80 | HR 66 | Ht 59.0 in | Wt 162.1 lb

## 2014-07-21 DIAGNOSIS — I2510 Atherosclerotic heart disease of native coronary artery without angina pectoris: Secondary | ICD-10-CM

## 2014-07-21 DIAGNOSIS — Z952 Presence of prosthetic heart valve: Secondary | ICD-10-CM

## 2014-07-21 DIAGNOSIS — H578 Other specified disorders of eye and adnexa: Secondary | ICD-10-CM

## 2014-07-21 DIAGNOSIS — I350 Nonrheumatic aortic (valve) stenosis: Secondary | ICD-10-CM

## 2014-07-21 DIAGNOSIS — E785 Hyperlipidemia, unspecified: Secondary | ICD-10-CM

## 2014-07-21 DIAGNOSIS — Z954 Presence of other heart-valve replacement: Secondary | ICD-10-CM

## 2014-07-21 DIAGNOSIS — I10 Essential (primary) hypertension: Secondary | ICD-10-CM

## 2014-07-21 DIAGNOSIS — H5789 Other specified disorders of eye and adnexa: Secondary | ICD-10-CM

## 2014-07-21 NOTE — Patient Instructions (Signed)
Call your eye doctor TODAY for an appointment.  I am concerned you may have an abrasion on your right eye.  If they cannot see you today, go to the urgent care for an exam.  The Urgent Care at [facility] should be able to help.  Keep an eye on your blood pressure.  Call us if the blood pressure is greater than 140/90.    Schedule a follow up with Dr. Lauree Chandler or Truitt Merle, NP in 3 months.

## 2014-07-21 NOTE — Progress Notes (Signed)
Cardiology Office Note   Date:  07/21/2014   ID:  Laurie Lambert, DOB Jan 06, 1956, MRN [id number]  PCP:  Pcp Not In System  Cardiologist:  Dr. Lauree Chandler     History of Present Illness: Laurie Lambert is a 58 y.o. female with a history of severe aortic stenosis, CAD, status post CABG and bioprosthetic AVR in 2014 c/b post op AFib (Amiodarone), HTN, HL. Last seen by Truitt Merle, NP 4/15. She returns for follow up.  Here with interpreter.  Overall, doing well.  The patient denies chest pain, shortness of breath, syncope, orthopnea, PND or significant pedal edema.  She stopped taking Metoprolol.  She felt this was causing insomnia.  She feels better off of it.  She monitors her BP at home and it has been optimal.  She notes higher BPs in the clinic.     Studies:  - LHC (11/14):  Mid LAD 50%, mid RCA 99%, distal RCA 99%  - Echo (2/15):  Mild focal basal hypertrophy of the septum, EF 60-65%, normal wall motion, grade 2 diastolic dysfunction,  AVR okay (mean gradient 12 mm Hg), mild MR  - Nuclear (10/14):  Reversible perfusion defect in the RCA territory, EF 56%, intermediate risk  - Carotid US (1/15):  Bilateral ICA 1-39%   Recent Labs/Images:  Recent Labs  12/05/13 0940  NA 138  K 3.7  BUN 19  CREATININE 0.6  ALT 30  HGB 11.1*  LDLCALC 103*  HDL 49.40      Wt Readings from Last 3 Encounters:  01/05/14 158 lb (71.668 kg)  12/05/13 155 lb (70.308 kg)  11/09/13 149 lb (67.586 kg)     Past Medical History  Diagnosis Date  . Hypertension   . Edema leg   . Hyperlipemia   . Coronary artery disease     a. LHC (11/14):  Mid LAD 50%, mid RCA 99%, distal RCA 99% >> s/p CABG  . Aortic stenosis     a. s/p bioprosthetic AVR 2014;  b. Echo (2/15):  Mild focal basal hypertrophy of the septum, EF 60-65%, normal wall motion, grade 2 diastolic dysfunction,  AVR okay (mean gradient 12 mm Hg), mild MR    Current Outpatient Prescriptions  Medication Sig Dispense Refill  .  aspirin EC 325 MG EC tablet Take 1 tablet (325 mg total) by mouth daily.  30 tablet  0  . lovastatin (MEVACOR) 20 MG tablet Take 1 tablet (20 mg total) by mouth at bedtime.  90 tablet  3  . metoprolol tartrate (LOPRESSOR) 25 MG tablet Take 0.5 tablets (12.5 mg total) by mouth 2 (two) times daily.  90 tablet  3  . TRIAMTERENE-HCTZ PO Take by mouth. Taking 37-25 mg daily       No current facility-administered medications for this visit.     Allergies:   Ace inhibitors   Social History:  The patient  reports that she has never smoked. She has never used smokeless tobacco. She reports that she does not drink alcohol or use illicit drugs.   Family History:  The patient's family history is negative for CAD.   ROS:  Please see the history of present illness.  She complains of R eye pain and redness - just saw eye dr yesterday.    All other systems reviewed and negative.    PHYSICAL EXAM: VS:  BP 150/80  Pulse 66  Ht 4\' 11"  (1.499 m)  Wt 162 lb 1.9 oz (73.537 kg)  BMI 32.73 kg/m2 Well  nourished, well developed, in no acute distress HEENT: R eye with large area of erythema c/w subconjunctival hemorrhage - there is a small area that appears suspicious for corneal abrasion Neck: no JVD Cardiac:  normal S1, S2; RRR; 2-4/5 systolic  murmurRUSB Lungs:  clear to auscultation bilaterally, no wheezing, rhonchi or rales Abd: soft, nontender, no hepatomegaly Ext: no edema Skin: warm and dry Neuro:  CNs 2-12 intact, no focal abnormalities noted  EKG:  NSR, HR 61, NSSTTW changes      ASSESSMENT AND PLAN:  1. Coronary artery disease:  Doing well.  No angina.  Continue ASA and statin.  She could not tolerate the beta blocker.    2. Aortic valve stenosis S/P AVR (aortic valve replacement):  Recent echo with well functioning bioprosthetic valve.  Continue SBE prophylaxis.    3. Essential hypertension:  Patient notes BP better at home.  She will continue to monitor her BP and call us if it runs  high.  I asked her to bring her machine to her next visit.   4. HLD (hyperlipidemia):  Continue statin.   5. Red eye:  I am concerned she has a corneal abrasion.  She needs a slit lamp examination with fluorescein.  I have asked her to see her eye doctor today or to go to the urgent care.   Disposition:   FU with Dr. Lauree Chandler or Truitt Merle, NP 3 mos   Signed, Versie Starks, MHS 07/21/2014 9:13 AM    [facility] [place], [place], [place]  [address] Phone: [phone]; Fax: [phone]

## 2014-09-14 ENCOUNTER — Encounter (HOSPITAL_COMMUNITY): Payer: Self-pay | Admitting: Cardiovascular Disease

## 2014-12-20 IMAGING — CR DG CHEST 1V PORT
1 series · 1 of 1 positions shown · non-contrast
Comparison: 10/10/2013

CLINICAL DATA: Status post aortic valve replacement

EXAM:
PORTABLE CHEST - 1 VIEW

[AP]
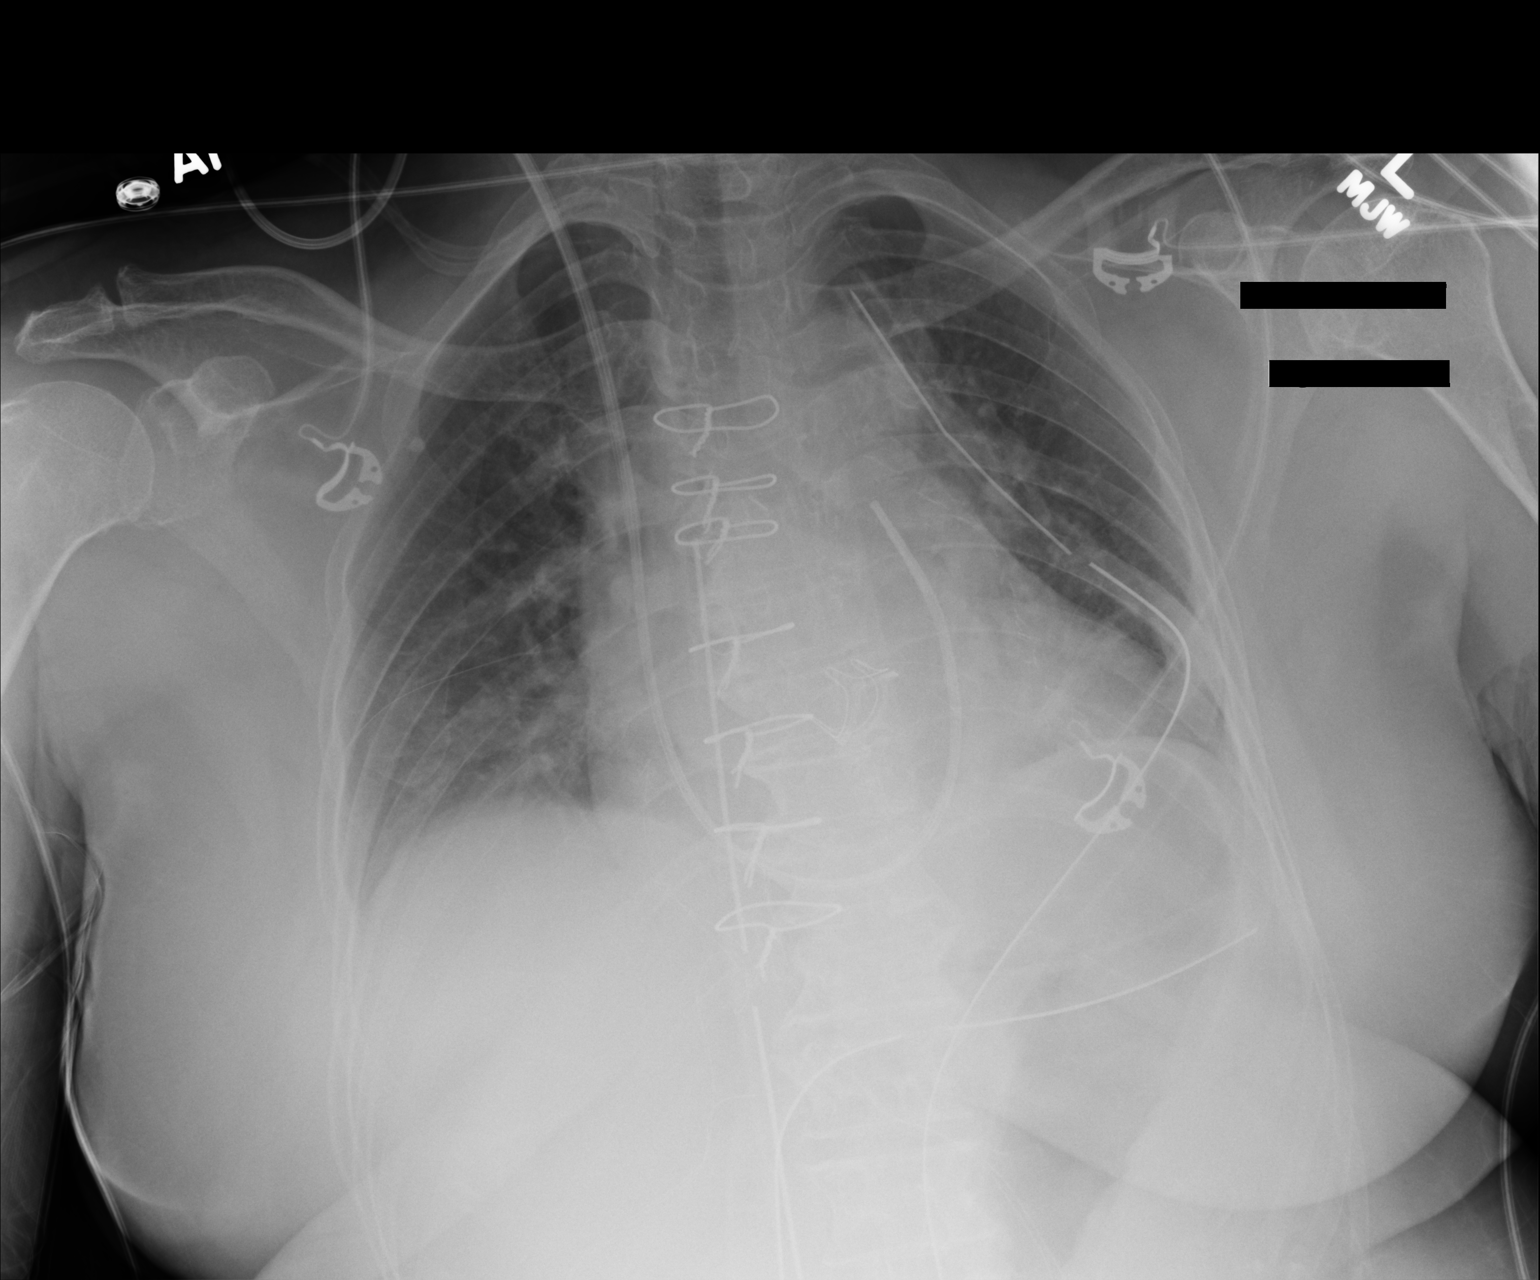

[1 of 1 positions shown; findings below may reference images not displayed]

FINDINGS: Swan-Ganz catheter is again noted in the pulmonary outflow tract. A
mediastinal drain and left thoracostomy catheter are noted. A drain
is noted along the inferior aspect of cardiac shadow and left lung
stable from the prior exam. The endotracheal tube and nasogastric
catheter been removed in the interval. The lungs are well-aerated
without focal infiltrate or sizable effusion.
IMPRESSION: Postoperative change with tubes and lines as described.

No acute abnormality is noted.

## 2014-12-21 IMAGING — DX DG CHEST 1V PORT
1 series · 1 of 1 positions shown · non-contrast
Comparison: 10/11/2013

CLINICAL DATA: CABG and aortic valve replacement.

EXAM:
PORTABLE CHEST - 1 VIEW

[portable]
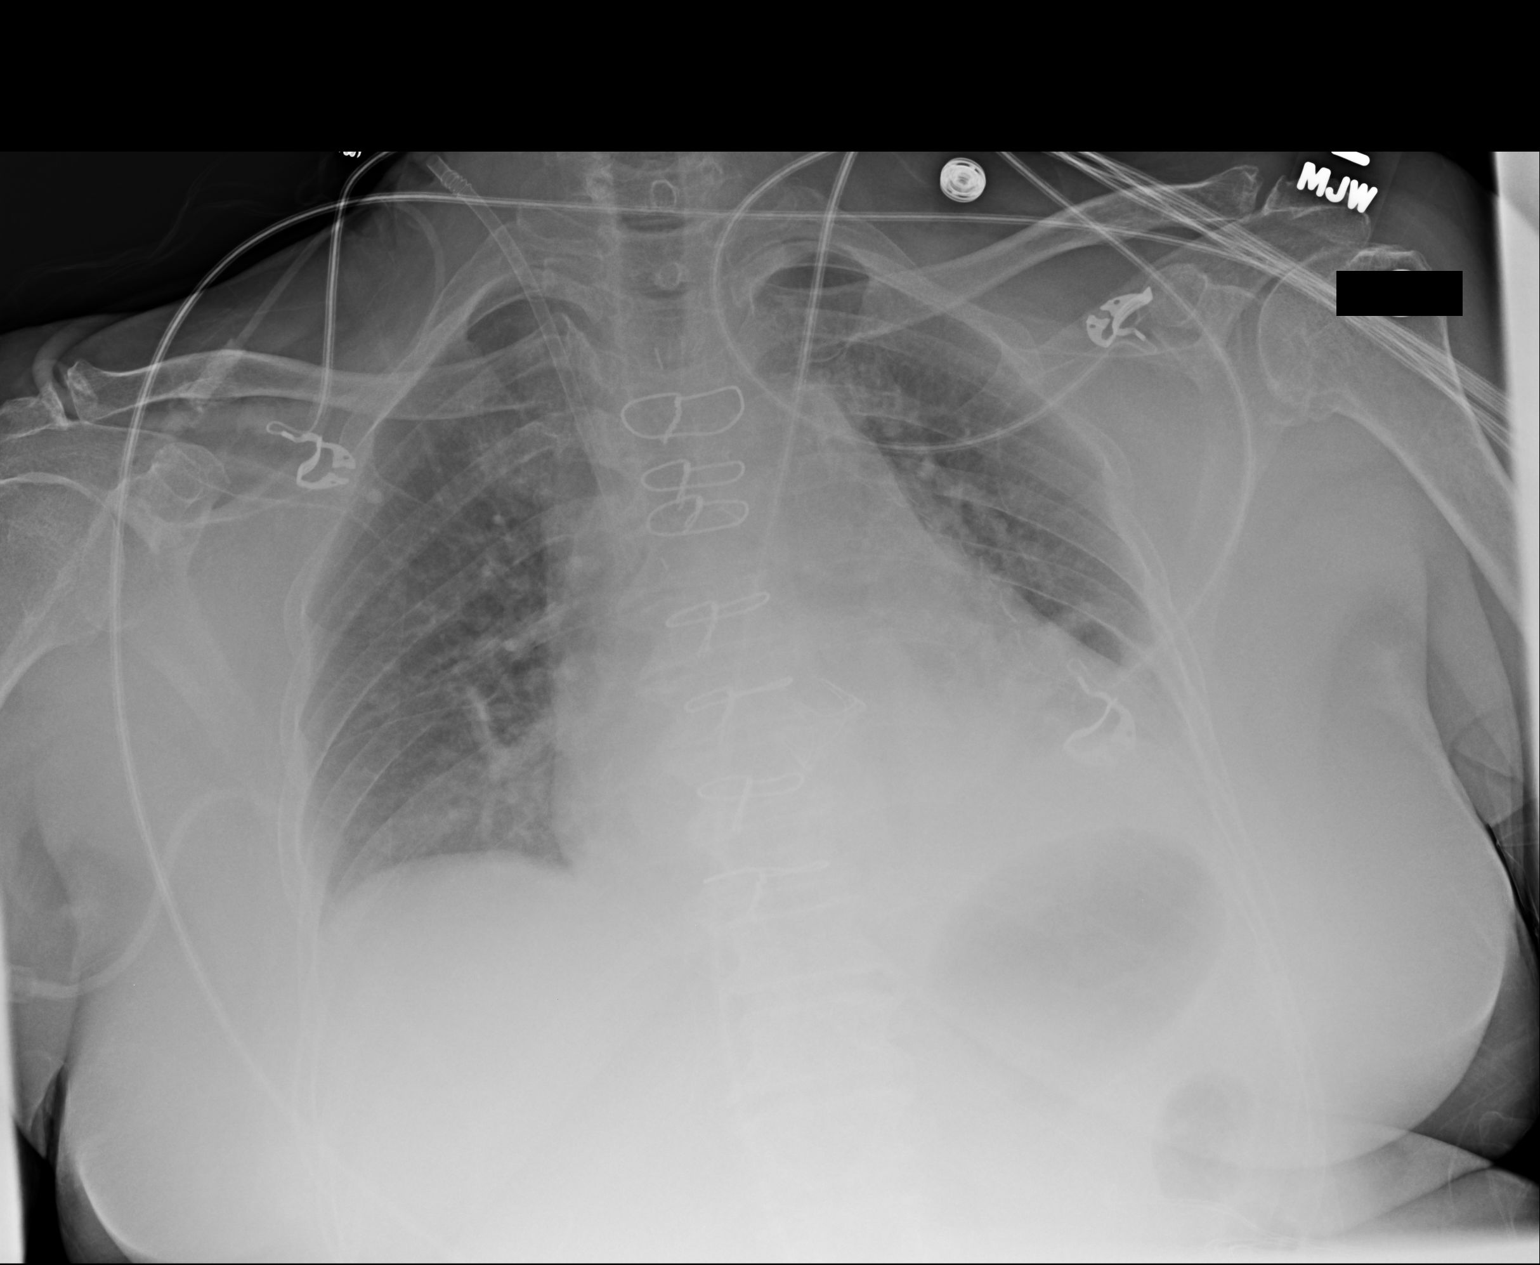

[1 of 1 positions shown; findings below may reference images not displayed]

FINDINGS: The chest tubes have been removed. Swan-Ganz catheter has been
removed. Right jugular central line is still present. No evidence
for a pneumothorax. Left basilar densities are suggestive for
atelectasis. Post cardiac surgery changes. Heart size is stable.
IMPRESSION: Removal of chest tubes without a pneumothorax.

Left basilar atelectasis.

## 2014-12-23 IMAGING — CR DG CHEST 2V
2 series · 2 of 2 positions shown · non-contrast
Comparison: 10/12/2012

CLINICAL DATA: Postop CABG.

EXAM:
CHEST  2 VIEW

[w chest pa]
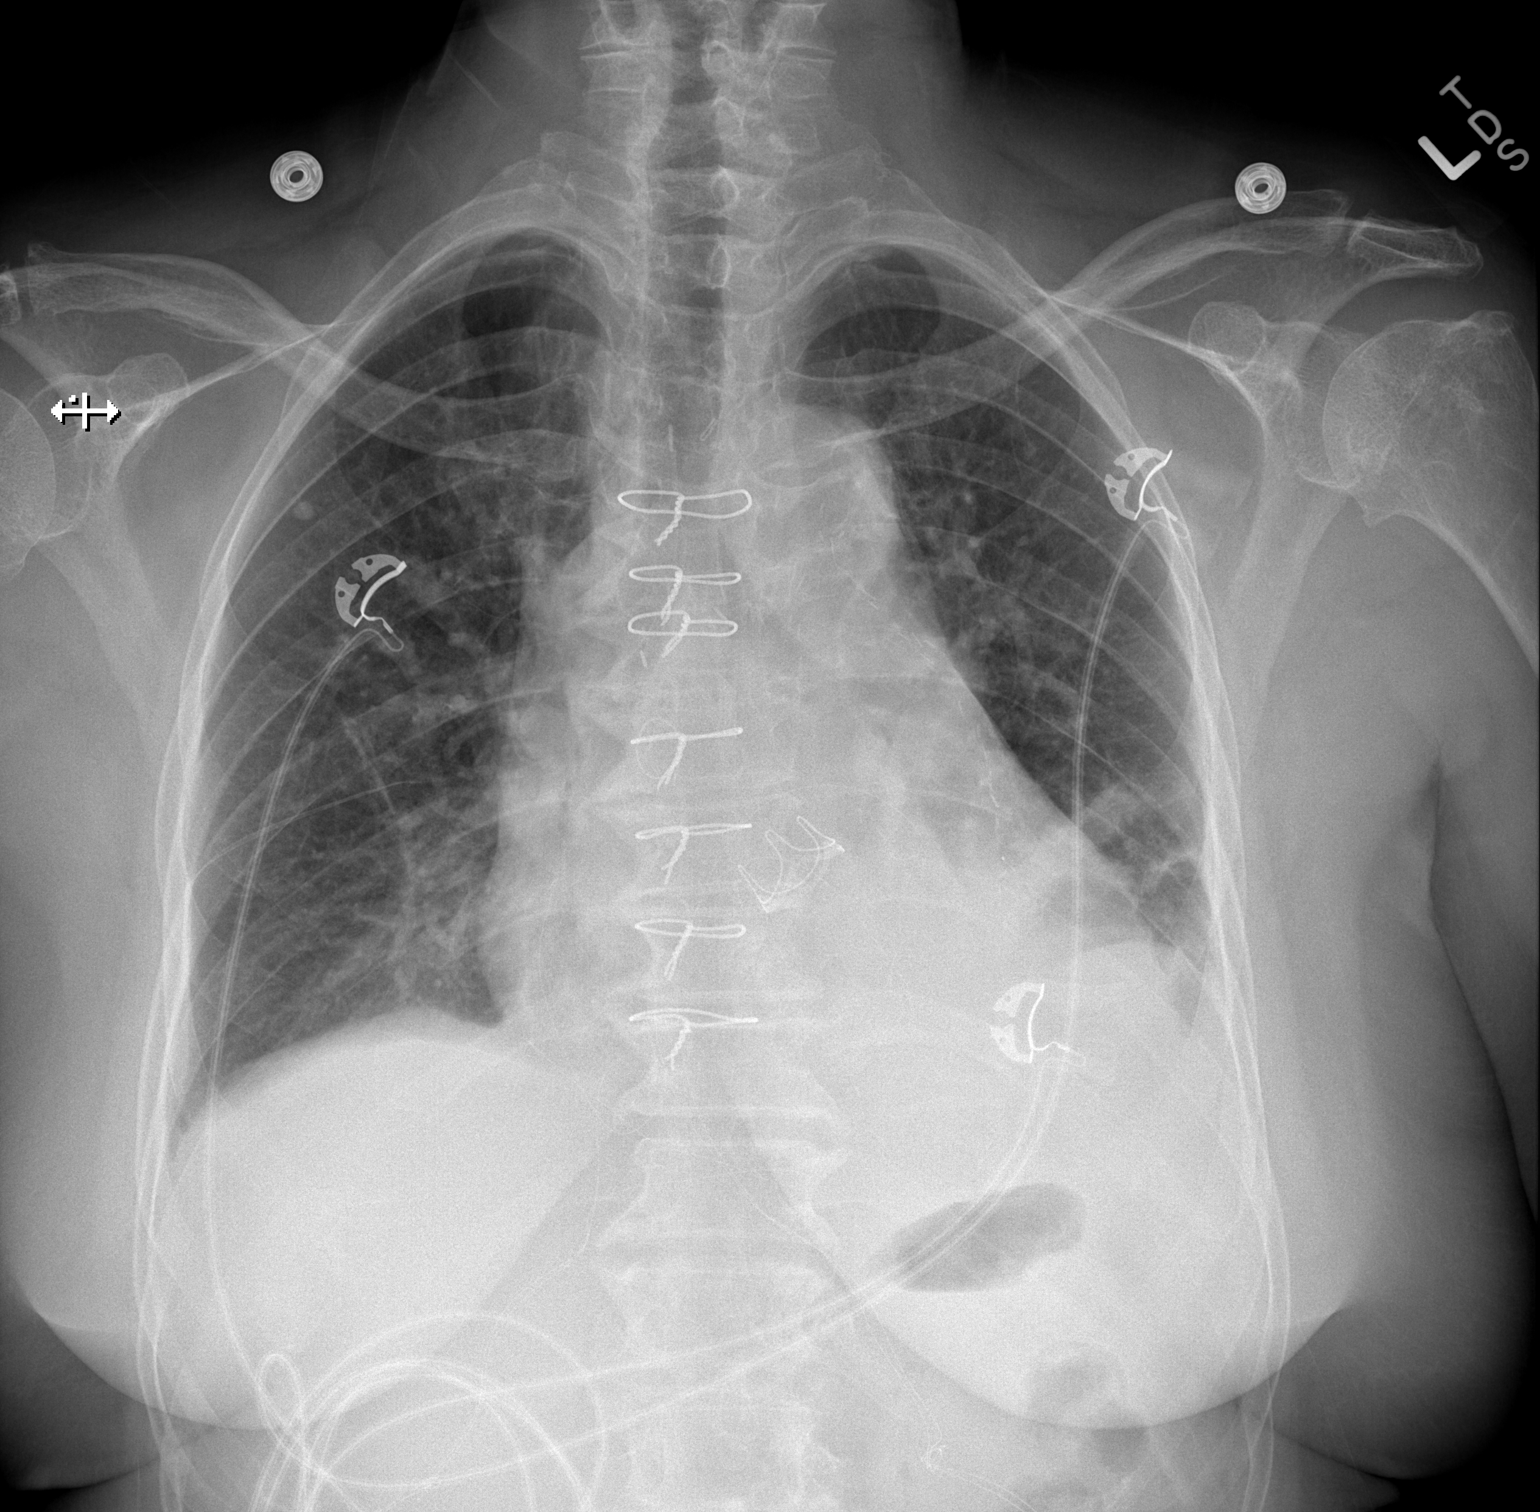

[w chest lat]
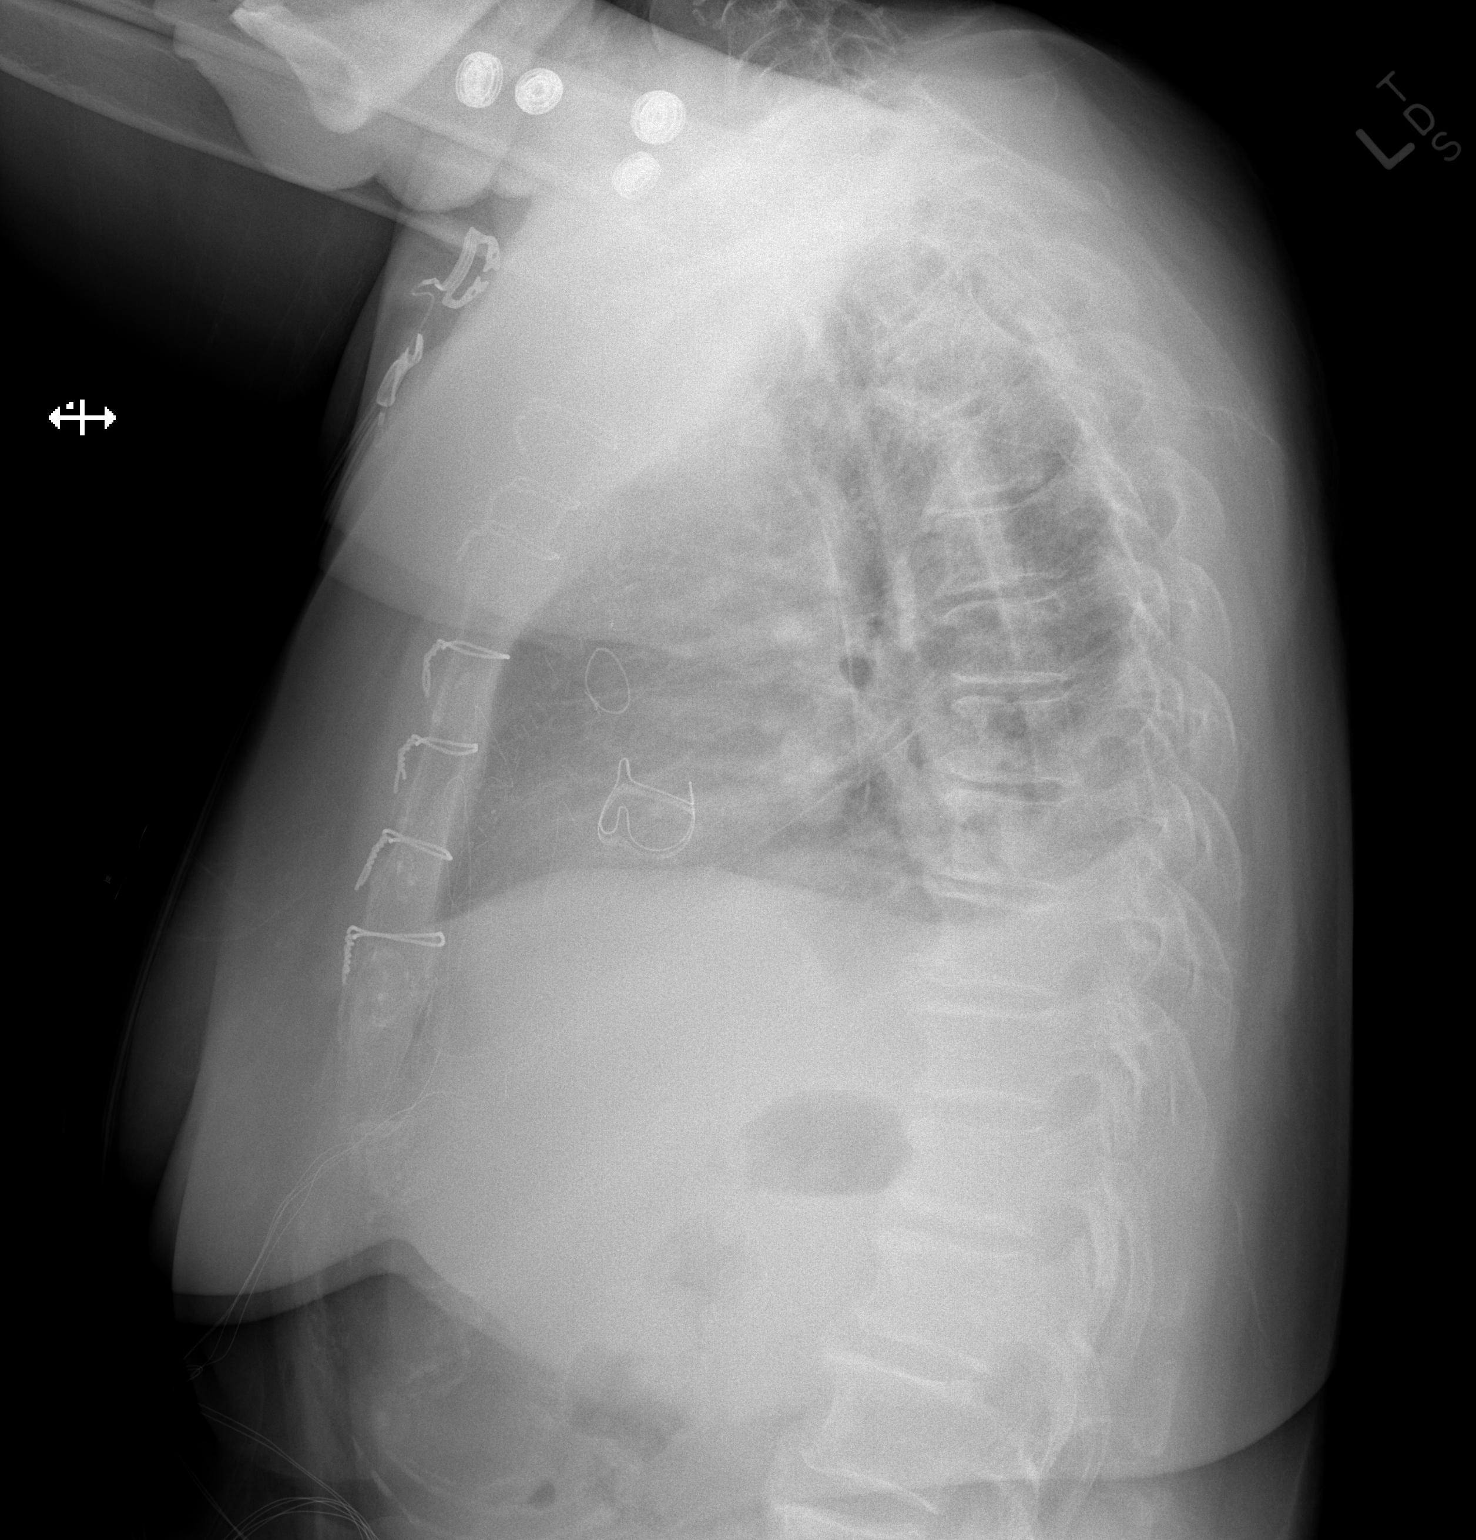

[2 of 2 positions shown; findings below may reference images not displayed]

FINDINGS: Stable heart size and mediastinal contours status post CABG and
aortic valve replacement. There is improving lung aeration with
smaller effusions and decreasing left basilar atelectasis. No
pneumothorax. Right IJ catheter has been removed. Calcified right
upper lobe granuloma.
IMPRESSION: Improving lung aeration. There is persistent postoperative
atelectasis and small effusions .

## 2014-12-29 ENCOUNTER — Ambulatory Visit (INDEPENDENT_AMBULATORY_CARE_PROVIDER_SITE_OTHER): Payer: 59 | Admitting: Nurse Practitioner

## 2014-12-29 ENCOUNTER — Encounter: Payer: Self-pay | Admitting: Nurse Practitioner

## 2014-12-29 VITALS — BP 152/78 | HR 70 | Ht 61.0 in | Wt 165.8 lb

## 2014-12-29 DIAGNOSIS — D179 Benign lipomatous neoplasm, unspecified: Secondary | ICD-10-CM | POA: Diagnosis not present

## 2014-12-29 NOTE — Patient Instructions (Addendum)
Stay on your current medicines  See me in 6 months  Keep a check on your blood pressure at home  We will refer you to General Surgery for evaluation of probable lipoma - possible removal  Call the [place] office at [phone] if you have any questions, problems or concerns.

## 2014-12-29 NOTE — Progress Notes (Signed)
CARDIOLOGY OFFICE NOTE  Date:  12/29/2014    Laurie Lambert Date of Birth: 1956/02/11 Medical Record #[id number]  PCP:  Unice Cobble, MD  Cardiologist:  [facility]    Chief Complaint  Patient presents with  . Coronary Artery Disease    5 month recheck - seen for Dr. Angelena Form  . Cardiac Valve Problem   History of Present Illness: Laurie Lambert is a 60 y.o. female who presents today for a follow up visit. Seen for Dr. Angelena Form. She has a history of severe aortic stenosis, CAD, status post CABG and bioprosthetic AVR in 2014 c/b post op AFib (Amiodarone), HTN, HL. Last seen by Richardson Dopp, PA back in October of 2015 - was doing well but had stopped taking Metoprolol. She felt this was causing insomnia. She felt better off of it. She monitors her BP at home and it has been optimal. She notes higher BPs in the clinic.   Comes in today. Here with family and an interpreter. She is doing well. No chest pain. Not short of breath. BP higher here today but better at home. Doing well. No real issues. 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 No chest pain. Breathing is good. Not dizzy. Asking about getting a probable lipoma removed from her neck. Has had recent labs with PCP just about 4 weeks ago.   Past Medical History  Diagnosis Date  . Hypertension   . Edema leg   . Hyperlipemia   . Coronary artery disease     a. LHC (11/14):  Mid LAD 50%, mid RCA 99%, distal RCA 99% >> s/p CABG  . Aortic stenosis     a. s/p bioprosthetic AVR 2014;  b. Echo (2/15):  Mild focal basal hypertrophy of the septum, EF 60-65%, normal wall motion, grade 2 diastolic dysfunction,  AVR okay (mean gradient 12 mm Hg), mild MR    Past Surgical History  Procedure Laterality Date  . Breast cyst removal    . Ankle surgery Right     25 years ago  . Tee without cardioversion N/A 08/12/2013    Procedure: TRANSESOPHAGEAL ECHOCARDIOGRAM (TEE);  Surgeon: Dorothy Spark, MD;  Location:  [place];  Service: Cardiovascular;  Laterality: N/A;  Spanish Interpreter needed  . Vaginal delivery      x4  . Tubal ligation    . Cardiac catheterization    . Eye surgery      lasik  . Uterine fibroid surgery  2010  . Aortic valve replacement N/A 10/10/2013    Procedure: AORTIC VALVE REPLACEMENT (AVR);  Surgeon: Gaye Pollack, MD;  Location: [place];  Service: Open Heart Surgery;  Laterality: N/A;  . Coronary artery bypass graft N/A 10/10/2013    Procedure: CORONARY ARTERY BYPASS GRAFTING TIMES THREE USING LEFT INTERNAL MAMMARY ARTERY AND RIGHT LEG GREATER SAPHENOUS VEIN HARVESTED ENDOSCOPICALLY;  Surgeon: Gaye Pollack, MD;  Location: [place];  Service: Open Heart Surgery;  Laterality: N/A;  . Intraoperative transesophageal echocardiogram N/A 10/10/2013    Procedure: INTRAOPERATIVE TRANSESOPHAGEAL ECHOCARDIOGRAM;  Surgeon: Gaye Pollack, MD;  Location: [facility] OR;  Service: Open Heart Surgery;  Laterality: N/A;  . Left and right heart catheterization with coronary angiogram N/A 08/12/2013    Procedure: LEFT AND RIGHT HEART CATHETERIZATION WITH CORONARY ANGIOGRAM;  Surgeon: Burnell Blanks, MD;  Location: [facility] CATH LAB;  Service: Cardiovascular;  Laterality: N/A;     Medications: Current Outpatient Prescriptions  Medication Sig Dispense Refill  . aspirin  EC 325 MG EC tablet Take 1 tablet (325 mg total) by mouth daily. 30 tablet 0  . lovastatin (MEVACOR) 20 MG tablet Take 1 tablet (20 mg total) by mouth at bedtime. 90 tablet 3  . TRIAMTERENE-HCTZ PO Take by mouth. Taking 37-25 mg daily     No current facility-administered medications for this visit.    Allergies: Allergies  Allergen Reactions  . Ace Inhibitors     cough    Social History: The patient  reports that she has never smoked. She has never used smokeless tobacco. She reports that she does not drink alcohol or use illicit drugs.   Family History: The patient's family history is negative for CAD.   Review of  Systems: Please see the history of present illness.   Otherwise, the review of systems is positive for .   All other systems are reviewed and negative.   Physical Exam: VS:  BP 152/78 mmHg  Pulse 70  Ht 5\' 1"  (1.549 m)  Wt 165 lb 12 oz (75.184 kg)  BMI 31.33 kg/m2  SpO2 98% .  BMI Body mass index is 31.33 kg/(m^2).  Repeat BP by me is 130/80.  Wt Readings from Last 3 Encounters:  12/29/14 165 lb 12 oz (75.184 kg)  07/21/14 162 lb 1.9 oz (73.537 kg)  01/05/14 158 lb (71.668 kg)    General: Pleasant. Well developed, well nourished and in no acute distress.  HEENT: Normal.She does have a probable lipoma on the left base of the neck.  Neck: Supple, no JVD, carotid bruits, or masses noted.  Cardiac: Regular rate and rhythm. She has a soft outflow murmur. Respiratory:  Lungs are clear to auscultation bilaterally with normal work of breathing.  GI: Soft and nontender.  MS: No deformity or atrophy. Gait and ROM intact. Skin: Warm and dry. Color is normal.  Neuro:  Strength and sensation are intact and no gross focal deficits noted.  Psych: Alert, appropriate and with normal affect.   LABORATORY DATA:  EKG:  EKG is not ordered today.  Lab Results  Component Value Date   WBC 5.6 12/05/2013   HGB 11.1* 12/05/2013   HCT 34.7* 12/05/2013   PLT 455.0* 12/05/2013   GLUCOSE 83 12/05/2013   CHOL 170 12/05/2013   TRIG 88.0 12/05/2013   HDL 49.40 12/05/2013   LDLCALC 103* 12/05/2013   ALT 30 12/05/2013   AST 28 12/05/2013   NA 138 12/05/2013   K 3.7 12/05/2013   CL 105 12/05/2013   CREATININE 0.6 12/05/2013   BUN 19 12/05/2013   CO2 23 12/05/2013   INR 1.69* 10/10/2013   HGBA1C 6.0* 10/07/2013    BNP (last 3 results) No results for input(s): BNP in the last 8760 hours.  ProBNP (last 3 results) No results for input(s): PROBNP in the last 8760 hours.   Other Studies Reviewed Today: Studies: - LHC (11/14): Mid LAD 50%, mid RCA 99%, distal RCA 99% - Echo (2/15): Mild  focal basal hypertrophy of the septum, EF 60-65%, normal wall motion, grade 2 diastolic dysfunction, AVR okay (mean gradient 12 mm Hg), mild MR - Nuclear (10/14): Reversible perfusion defect in the RCA territory, EF 56%, intermediate risk - Carotid US (1/15): Bilateral ICA 1-39%   Assessment/Plan: 1. Post AVR/CABG - she continues to do well. Continue with current plan of care.   2. HTN - BP better at home. She monitors regularly. Recheck by me looks good.   3. HLD - on statin - labs followed by  PCP  4. Post op PAF - has not recurred  5. Probable lipoma - wants to see about having this removed - will refer to [facility].  Current medicines are reviewed with the patient today.  The patient does not have concerns regarding medicines other than what has been noted above.  The following changes have been made:  See above.  Labs/ tests ordered today include:   No orders of the defined types were placed in this encounter.     Disposition:   FU with me in 6 months  Patient is agreeable to this plan and will call if any problems develop in the interim.   Signed: Burtis Junes, RN, ANP-C 12/29/2014 11:25 AM  [facility] [address] [place], [place]  [address] Phone: [phone] Fax: [phone]

## 2015-01-18 IMAGING — CR DG CHEST 2V
2 series · 2 of 2 positions shown · non-contrast
Comparison: 10/14/2013

CLINICAL DATA: Status post CABG.

EXAM:
CHEST  2 VIEW

[w chest pa]
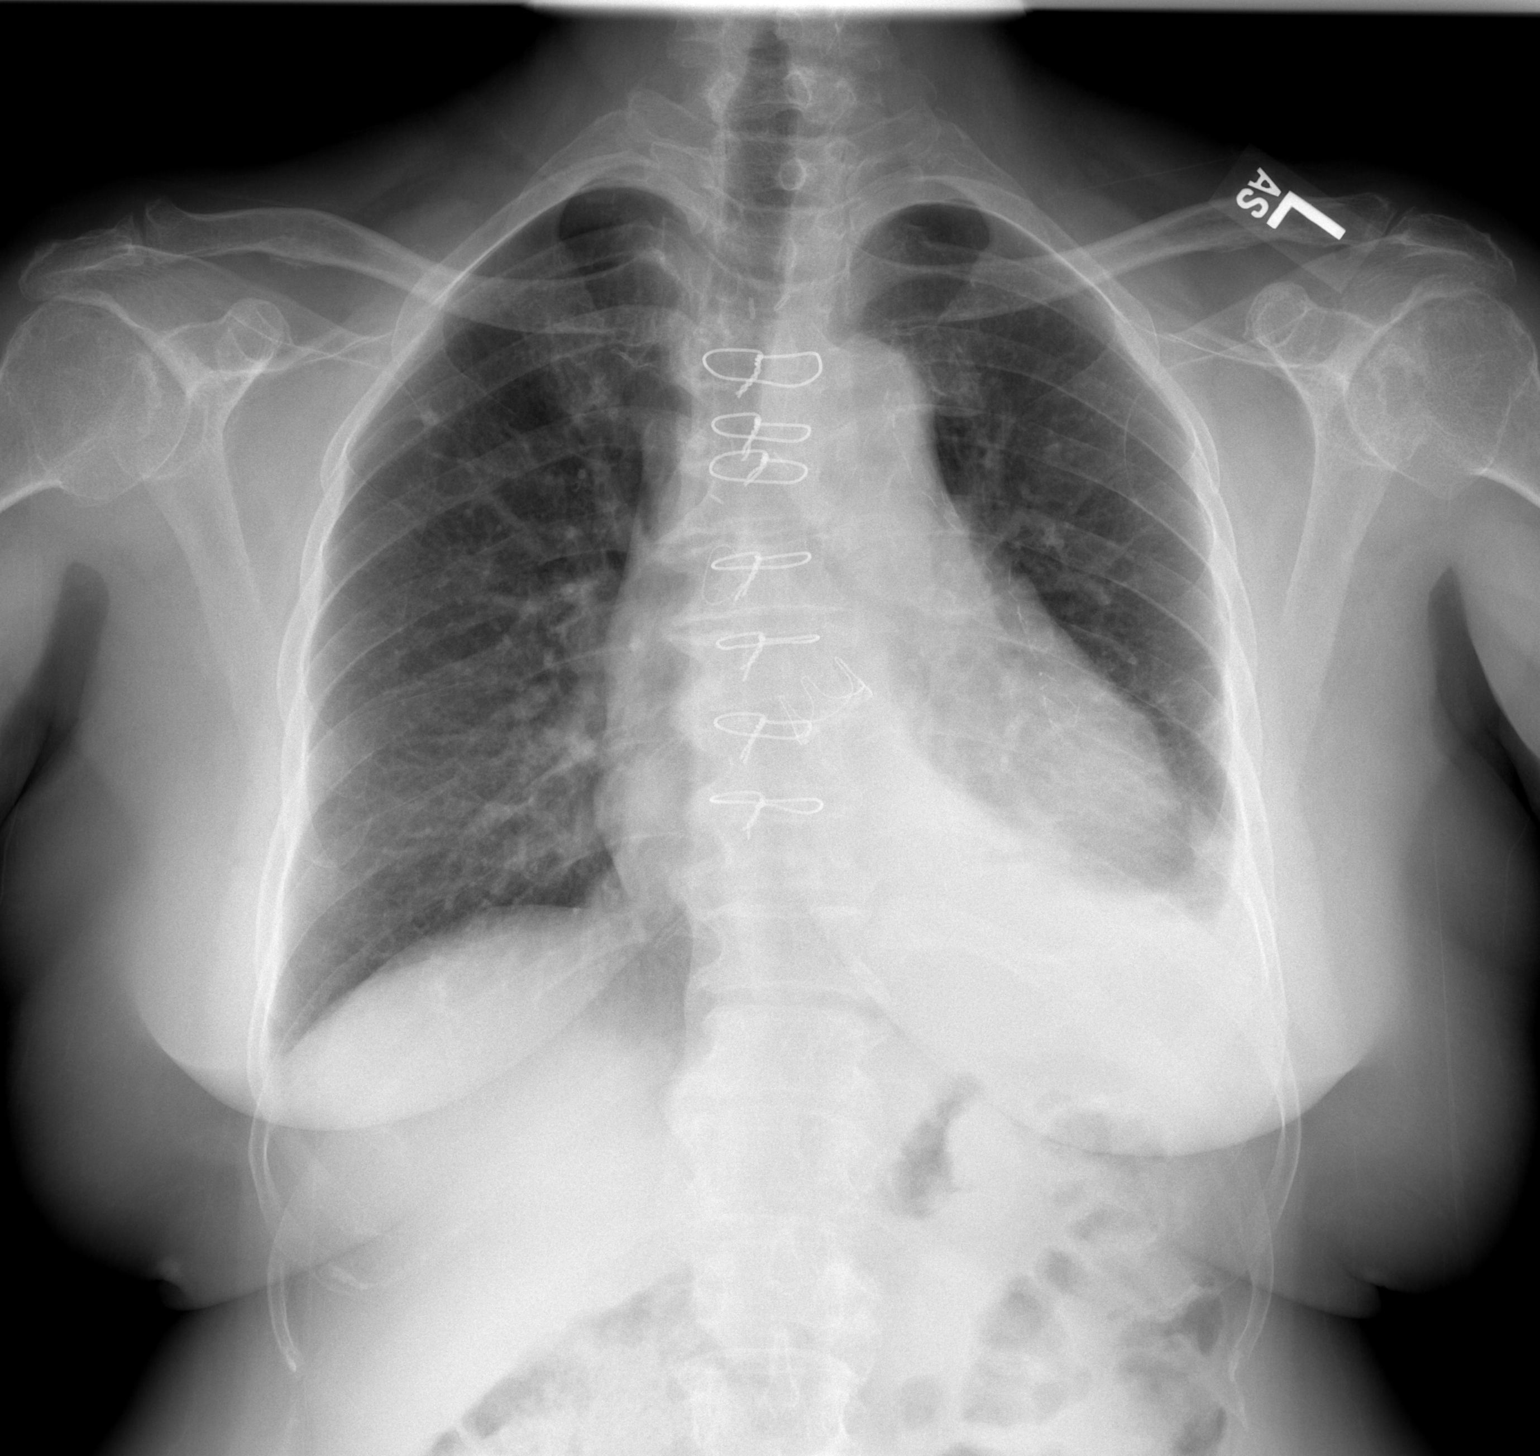

[w chest lat]
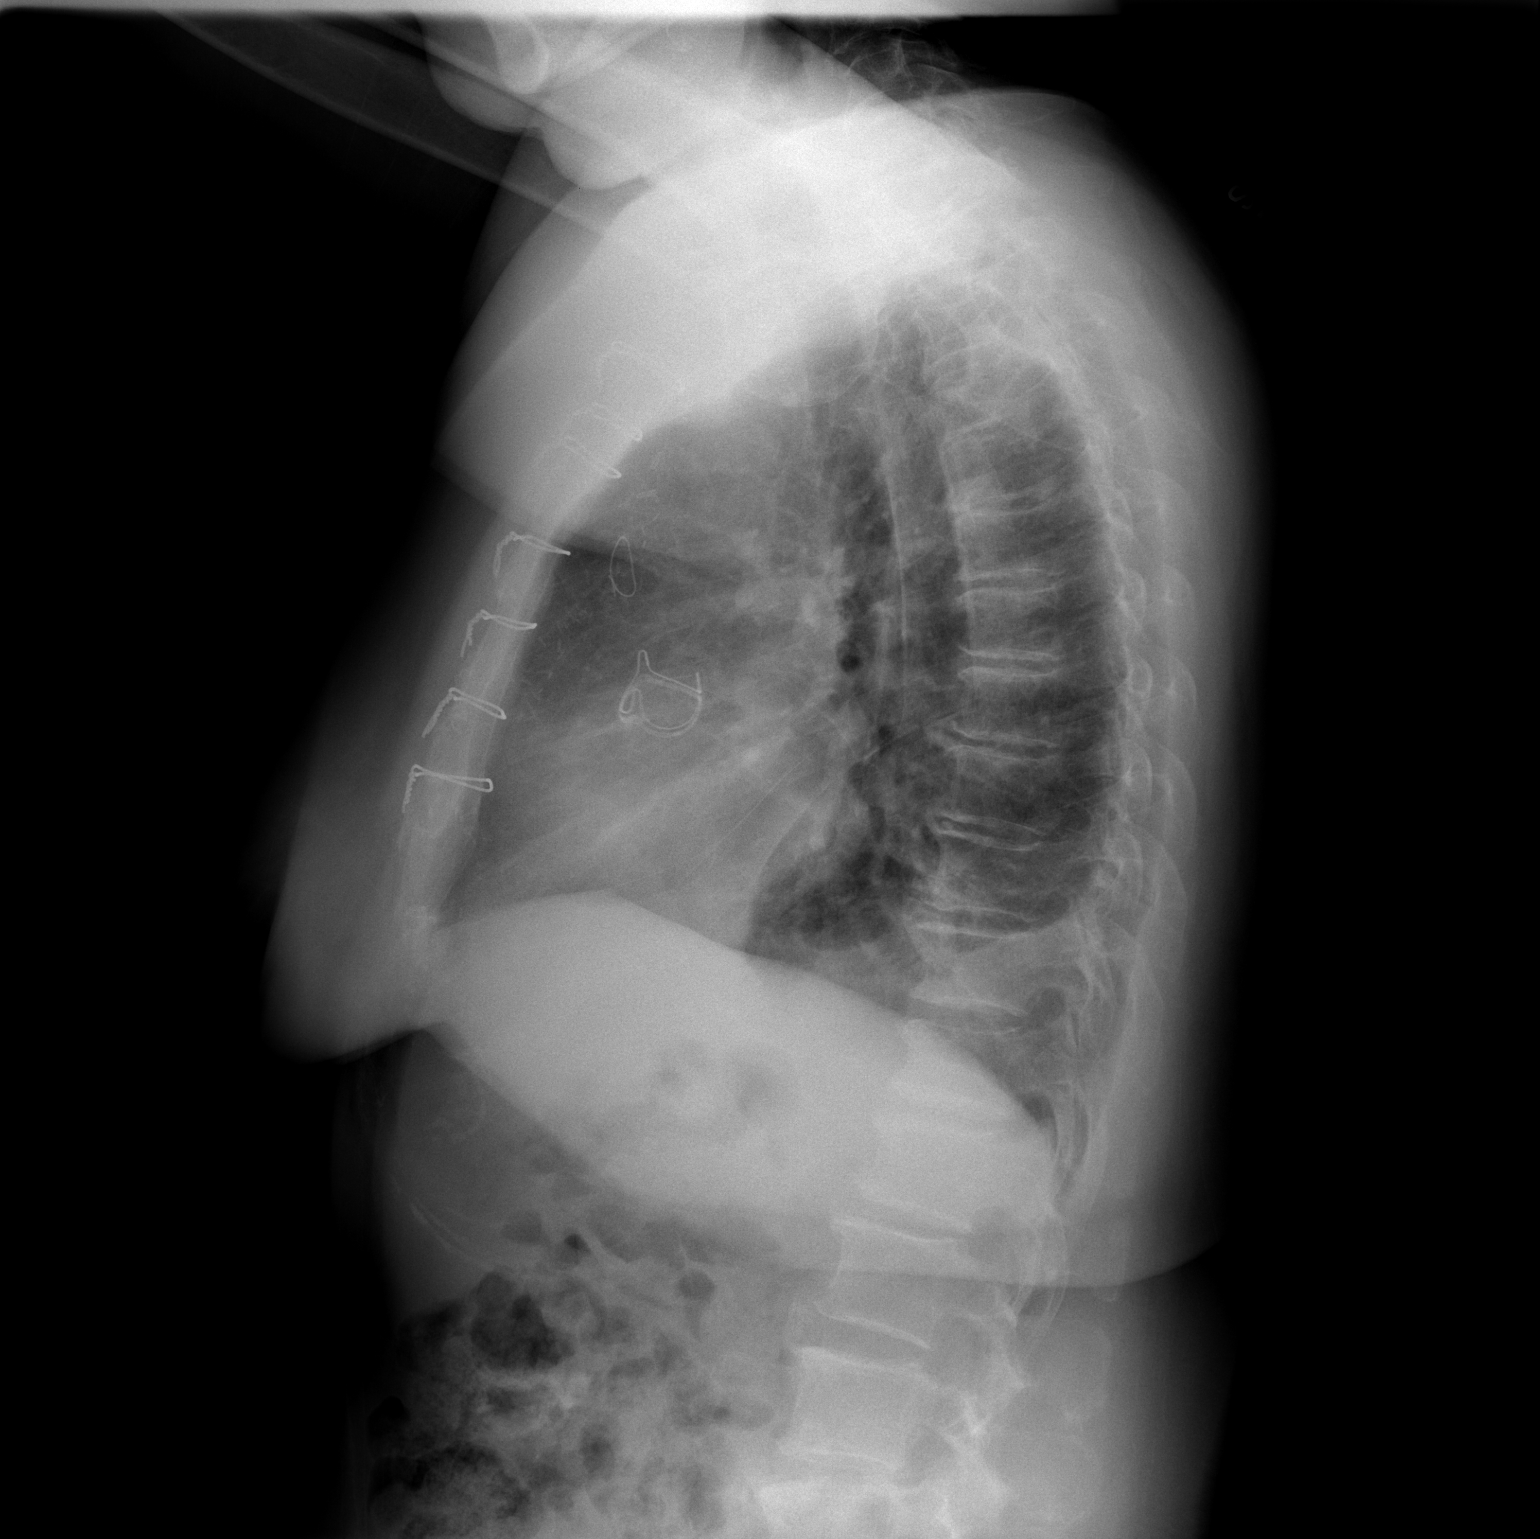

[2 of 2 positions shown; findings below may reference images not displayed]

FINDINGS: The cardio pericardial silhouette is enlarged. Stable to slightly
increased left pleural effusion. Atelectasis at the left base seen
on the previous study has decreased in the interval. The right base
atelectasis seen previously has resolved. Interval decrease in
pulmonary vascular congestion. Nodule overlying the right upper lung
is most consistent with a calcified granuloma given its density
relative to its small size. Bones are diffusely demineralized.
IMPRESSION: Stable 2 possible slight increase in left pleural effusion.

Interval decrease in bibasilar atelectasis and associated decrease
in vascular congestion.

## 2015-01-25 ENCOUNTER — Encounter: Payer: Self-pay | Admitting: Cardiology

## 2015-01-25 ENCOUNTER — Ambulatory Visit (INDEPENDENT_AMBULATORY_CARE_PROVIDER_SITE_OTHER): Payer: 59 | Admitting: Cardiology

## 2015-01-25 ENCOUNTER — Telehealth: Payer: Self-pay | Admitting: Nurse Practitioner

## 2015-01-25 VITALS — BP 136/74 | HR 78 | Ht 61.0 in | Wt 165.8 lb

## 2015-01-25 DIAGNOSIS — R079 Chest pain, unspecified: Secondary | ICD-10-CM | POA: Diagnosis not present

## 2015-01-25 NOTE — Telephone Encounter (Signed)
Pt's son called stating that pt had an episode of severe chest pain, SOB, and left arm numbness; 2 days ago. The chest pain woke her up at about to 1 AM at that time. Pt took 2 NTG's SL before the pain went away, and took 2 NTG's that morning when she got up Pt does not have an active chest pain now but pt wants to be seen today or tomorrow. Brittainy Simmons PA is aware of pt's symptoms; and agreed to seen the pt this afternoon. Pt has an appointment today at 3:00 Pm. Pt's son and pt are aware.

## 2015-01-25 NOTE — Telephone Encounter (Signed)
Pt c/o of Chest Pain: STAT if CP now or developed within 24 hours  1. Are you having CP right now? No  2. Are you experiencing any other symptoms (ex. SOB, nausea, vomiting, sweating)? Sob, sweating, couldn't breathe, tingling sensation in left arm  3. How long have you been experiencing CP? 2 days ago  4. Is your CP continuous or coming and going? Coming and going, continuous pain in her left arm since then  5. Have you taken Nitroglycerin? Yes ?

## 2015-01-25 NOTE — Patient Instructions (Signed)
Medication Instructions:  Your physician recommends that you continue on your current medications as directed. Please refer to the Current Medication list given to you today.   Labwork: None   Testing/Procedures: Your physician has requested that you have a lexiscan myoview. For further information please visit [email or web address]. Please follow instruction sheet, as given.  Your physician has requested that you have an echocardiogram. Echocardiography is a painless test that uses sound waves to create images of your heart. It provides your doctor with information about the size and shape of your heart and how well your heart's chambers and valves are working. This procedure takes approximately one hour. There are no restrictions for this procedure.  Follow-Up: Your physician recommends that you schedule a follow-up appointment pending results   Any Other Special Instructions Will Be Listed Below (If Applicable).
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
## 2015-01-26 ENCOUNTER — Other Ambulatory Visit (HOSPITAL_COMMUNITY): Payer: Self-pay | Admitting: Cardiology

## 2015-01-26 ENCOUNTER — Ambulatory Visit (HOSPITAL_COMMUNITY): Admission: RE | Admit: 2015-01-26 | Payer: 59 | Source: Ambulatory Visit

## 2015-01-26 ENCOUNTER — Ambulatory Visit (HOSPITAL_COMMUNITY): Payer: 59 | Attending: Cardiology | Admitting: Radiology

## 2015-01-26 DIAGNOSIS — I359 Nonrheumatic aortic valve disorder, unspecified: Secondary | ICD-10-CM | POA: Diagnosis present

## 2015-01-26 DIAGNOSIS — I10 Essential (primary) hypertension: Secondary | ICD-10-CM | POA: Insufficient documentation

## 2015-01-26 DIAGNOSIS — E785 Hyperlipidemia, unspecified: Secondary | ICD-10-CM | POA: Diagnosis not present

## 2015-01-26 DIAGNOSIS — R079 Chest pain, unspecified: Secondary | ICD-10-CM

## 2015-01-26 NOTE — Progress Notes (Signed)
Echocardiogram performed.  

## 2015-01-29 ENCOUNTER — Telehealth (HOSPITAL_COMMUNITY): Payer: Self-pay | Admitting: *Deleted

## 2015-07-06 ENCOUNTER — Ambulatory Visit: Payer: 59 | Admitting: Nurse Practitioner

## 2015-10-04 ENCOUNTER — Telehealth: Payer: Self-pay | Admitting: Physician Assistant

## 2015-10-04 ENCOUNTER — Ambulatory Visit (INDEPENDENT_AMBULATORY_CARE_PROVIDER_SITE_OTHER): Payer: 59 | Admitting: Physician Assistant

## 2015-10-04 ENCOUNTER — Other Ambulatory Visit: Payer: Self-pay

## 2015-10-04 ENCOUNTER — Other Ambulatory Visit: Payer: Self-pay | Admitting: Physician Assistant

## 2015-10-04 ENCOUNTER — Encounter: Payer: Self-pay | Admitting: Physician Assistant

## 2015-10-04 VITALS — BP 164/72 | HR 70 | Temp 99.1°F | Ht 61.0 in | Wt 162.0 lb

## 2015-10-04 DIAGNOSIS — R011 Cardiac murmur, unspecified: Secondary | ICD-10-CM

## 2015-10-04 DIAGNOSIS — R079 Chest pain, unspecified: Secondary | ICD-10-CM

## 2015-10-04 LAB — TROPONIN I: Troponin I: <0.03 ng/mL (ref <0.031)

## 2015-10-04 MED ORDER — LOVASTATIN 20 MG PO TABS: 20.0000 mg | ORAL_TABLET | Freq: Every day | ORAL | Status: DC

## 2015-10-04 MED ORDER — TRIAMTERENE-HCTZ 37.5-25 MG PO TABS: 1.0000 | ORAL_TABLET | Freq: Every day | ORAL | Status: DC

## 2015-10-04 MED ORDER — AMLODIPINE BESYLATE 10 MG PO TABS: 10.0000 mg | ORAL_TABLET | Freq: Every day | ORAL | Status: DC

## 2015-10-04 NOTE — Addendum Note (Signed)
Addended by: Gaetano Net on: 10/04/2015 04:23 PM   Modules accepted: Orders

## 2015-10-04 NOTE — Telephone Encounter (Signed)
      I spoke to [place] from the [facility] who called me after hours. She had a stat troponin from the office dropped off to her. She was unable to link the order placed by Almyra Deforest PA-C as the account was inactive??? I searched for an account number in the chart and found "[id number]". I am not sure what account number this is but she thinks it may have worked. If she still cannot process the troponin, we willl have to call the patient and figure out another way to evaluate his chest pain. Will route to [employer].    Angelena Form PA-C  [facility]

## 2015-10-04 NOTE — Patient Instructions (Signed)
Medication Instructions:  Your physician recommends that you continue on your current medications as directed. Please refer to the Current Medication list given to you today.   Labwork: TODAY:  TROPONIN  Testing/Procedures: Your physician has requested that you have an echocardiogram. Echocardiography is a painless test that uses sound waves to create images of your heart. It provides your doctor with information about the size and shape of your heart and how well your heart's chambers and valves are working. This procedure takes approximately one hour. There are no restrictions for this procedure.   Your physician has requested that you have a lexiscan myoview. For further information please visit [email or web address]. Please follow instruction sheet, as given.    Follow-Up: Your physician recommends that you schedule a follow-up appointment in:  2 [place] APP   Any Other Special Instructions Will Be Listed Below (If Applicable).  Echocardiogram An echocardiogram, or echocardiography, uses sound waves (ultrasound) to produce an image of your heart. The echocardiogram is simple, painless, obtained within a short period of time, and offers valuable information to your health care provider. The images from an echocardiogram can provide information such as:  Evidence of coronary artery disease (CAD).  Heart size.  Heart muscle function.  Heart valve function.  Aneurysm detection.  Evidence of a past heart attack.  Fluid buildup around the heart.  Heart muscle thickening.  Assess heart valve function. LET [facility] CARE PROVIDER KNOW ABOUT:  Any allergies you have.  All medicines you are taking, including vitamins, herbs, eye drops, creams, and over-the-counter medicines.  Previous problems you or members of your family have had with the use of anesthetics.  Any blood disorders you have.  Previous surgeries you have had.  Medical conditions you  have.  Possibility of pregnancy, if this applies. BEFORE THE PROCEDURE  No special preparation is needed. Eat and drink normally.  PROCEDURE   In order to produce an image of your heart, gel will be applied to your chest and a wand-like tool (transducer) will be moved over your chest. The gel will help transmit the sound waves from the transducer. The sound waves will harmlessly bounce off your heart to allow the heart images to be captured in real-time motion. These images will then be recorded.  You may need an IV to receive a medicine that improves the quality of the pictures. AFTER THE PROCEDURE You may return to your normal schedule including diet, activities, and medicines, unless your health care provider tells you otherwise.   This information is not intended to replace advice given to you by your health care provider. Make sure you discuss any questions you have with your health care provider.   Document Released: 09/19/2000 Document Revised: 10/13/2014 Document Reviewed: 05/30/2013 Elsevier Interactive Patient Education 2016 [place].  Pharmacologic Stress Electrocardiogram A pharmacologic stress electrocardiogram is a heart (cardiac) test that uses nuclear imaging to evaluate the blood supply to your heart. This test may also be called a pharmacologic stress electrocardiography. Pharmacologic means that a medicine is used to increase your heart rate and blood pressure.  This stress test is done to find areas of poor blood flow to the heart by determining the extent of coronary artery disease (CAD). Some people exercise on a treadmill, which naturally increases the blood flow to the heart. For those people unable to exercise on a treadmill, a medicine is used. This medicine stimulates your heart and will cause your heart to beat harder and more quickly,  as if you were exercising.  Pharmacologic stress tests can help determine:  The adequacy of blood flow to your heart during  increased levels of activity in order to clear you for discharge home.  The extent of coronary artery blockage caused by CAD.  Your prognosis if you have suffered a heart attack.  The effectiveness of cardiac procedures done, such as an angioplasty, which can increase the circulation in your coronary arteries.  Causes of chest pain or pressure. LET [facility] CARE PROVIDER KNOW ABOUT:  Any allergies you have.  All medicines you are taking, including vitamins, herbs, eye drops, creams, and over-the-counter medicines.  Previous problems you or members of your family have had with the use of anesthetics.  Any blood disorders you have.  Previous surgeries you have had.  Medical conditions you have.  Possibility of pregnancy, if this applies.  If you are currently breastfeeding. RISKS AND COMPLICATIONS Generally, this is a safe procedure. However, as with any procedure, complications can occur. Possible complications include:  You develop pain or pressure in the following areas:  Chest.  Jaw or neck.  Between your shoulder blades.  Radiating down your left arm.  Headache.  Dizziness or light-headedness.  Shortness of breath.  Increased or irregular heartbeat.  Low blood pressure.  Nausea or vomiting.  Flushing.  Redness going up the arm and slight pain during injection of medicine.  Heart attack (rare). BEFORE THE PROCEDURE   Avoid all forms of caffeine for 24 hours before your test or as directed by your health care provider. This includes coffee, tea (even decaffeinated tea), caffeinated sodas, chocolate, cocoa, and certain pain medicines.  Follow your health care provider's instructions regarding eating and drinking before the test.  Take your medicines as directed at regular times with water unless instructed otherwise. Exceptions may include:  If you have diabetes, ask how you are to take your insulin or pills. It is common to adjust insulin dosing the  morning of the test.  If you are taking beta-blocker medicines, it is important to talk to your health care provider about these medicines well before the date of your test. Taking beta-blocker medicines may interfere with the test. In some cases, these medicines need to be changed or stopped 24 hours or more before the test.  If you wear a nitroglycerin patch, it may need to be removed prior to the test. Ask your health care provider if the patch should be removed before the test.  If you use an inhaler for any breathing condition, bring it with you to the test.  If you are an outpatient, bring a snack so you can eat right after the stress phase of the test.  Do not smoke for 4 hours prior to the test or as directed by your health care provider.  Do not apply lotions, powders, creams, or oils on your chest prior to the test.  Wear comfortable shoes and clothing. Let your health care provider know if you were unable to complete or follow the preparations for your test. PROCEDURE   Multiple patches (electrodes) will be put on your chest. If needed, small areas of your chest may be shaved to get better contact with the electrodes. Once the electrodes are attached to your body, multiple wires will be attached to the electrodes, and your heart rate will be monitored.  An IV access will be started. A nuclear trace (isotope) is given. The isotope may be given intravenously, or it may be swallowed.  Nuclear refers to several types of radioactive isotopes, and the nuclear isotope lights up the arteries so that the nuclear images are clear. The isotope is absorbed by your body. This results in low radiation exposure.  A resting nuclear image is taken to show how your heart functions at rest.  A medicine is given through the IV access.  A second scan is done about 1 hour after the medicine injection and determines how your heart functions under stress.  During this stress phase, you will be  connected to an electrocardiogram machine. Your blood pressure and oxygen levels will be monitored. AFTER THE PROCEDURE   Your heart rate and blood pressure will be monitored after the test.  You may return to your normal schedule, including diet,activities, and medicines, unless your health care provider tells you otherwise.   This information is not intended to replace advice given to you by your health care provider. Make sure you discuss any questions you have with your health care provider.   Document Released: 02/08/2009 Document Revised: 09/27/2013 Document Reviewed: 05/30/2013 Elsevier Interactive Patient Education [employer].   If you need a refill on your cardiac medications before your next appointment, please call your pharmacy.

## 2015-10-04 NOTE — Progress Notes (Signed)
Cardiology Office Note   Date:  10/04/2015   ID:  Laurie Lambert, DOB 1955/12/31, MRN [id number]  PCP:  Unice Cobble, MD  Cardiologist:  Dr. Angelena Form   Chief Complaint  Patient presents with  . Chest Pain    seen for Dr. Angelena Form      History of Present Illness: Laurie Lambert is a 59 y.o. female who presents as add on for chest pain. She speaks no [place], her husband acted as Optometrist. She has a history of severe AS, CAD s/p CABG and bioprosthetic aVR in January 2015 c/b post op afib, HTN and HLD. She was initially seen by Dr. Angelena Form in October 2014 with complaint of chest discomfort. Echocardiogram showed severe ALS. She underwent diagnostic cardiac catheterization which showed severe AS with mean gradient 46, 50% mid LAD stenosis, large dominant RCA with long 99% stenosis in mid vessel followed by 99% stenosis in distal vessel. She underwent bioprosthetic AVR with 21 mm Edwards pericardial valve and a three-vessel CABG with LIMA to LAD, SVG to AM and PDA. She was last seen in the office on 01/25/2015 with complaint of chest discomfort. Echocardiogram was repeated in Apr 2016 which shows EF Q000111Q, grade 1 diastolic dysfunction, aortic valve well seated in aortic position with no central regurgitation or perivalvular leak, trivial MR, normal RVSP, mild TR. Unfortunately she did not do her stress test.  She presents today to the [facility] as add on with complaint of chest pain and back pain. According to family, she has been having intermittent chest pressure. Exacerbating factors include body rotation and deep inspiration. She also has been having some very focal back pain medial to the left shoulder blade. It is worse with palpation and body rotation. She also complained that her heart has been pounding more heavily recently. She has not taken her medication as instructed. On initial office visit her blood pressure was 164/72.    Past Medical History  Diagnosis Date  .  Hypertension   . Edema leg   . Hyperlipemia   . Coronary artery disease     a. LHC (11/14):  Mid LAD 50%, mid RCA 99%, distal RCA 99% >> s/p CABG  . Aortic stenosis     a. s/p bioprosthetic AVR 2014;  b. Echo (2/15):  Mild focal basal hypertrophy of the septum, EF 60-65%, normal wall motion, grade 2 diastolic dysfunction,  AVR okay (mean gradient 12 mm Hg), mild MR    Past Surgical History  Procedure Laterality Date  . Breast cyst removal    . Ankle surgery Right     25 years ago  . Tee without cardioversion N/A 08/12/2013    Procedure: TRANSESOPHAGEAL ECHOCARDIOGRAM (TEE);  Surgeon: Dorothy Spark, MD;  Location: [facility];  Service: Cardiovascular;  Laterality: N/A;  Spanish Interpreter needed  . Vaginal delivery      x4  . Tubal ligation    . Cardiac catheterization    . Eye surgery      lasik  . Uterine fibroid surgery  2010  . Aortic valve replacement N/A 10/10/2013    Procedure: AORTIC VALVE REPLACEMENT (AVR);  Surgeon: Gaye Pollack, MD;  Location: [facility];  Service: Open Heart Surgery;  Laterality: N/A;  . Coronary artery bypass graft N/A 10/10/2013    Procedure: CORONARY ARTERY BYPASS GRAFTING TIMES THREE USING LEFT INTERNAL MAMMARY ARTERY AND RIGHT LEG GREATER SAPHENOUS VEIN HARVESTED ENDOSCOPICALLY;  Surgeon: Gaye Pollack, MD;  Location: [facility];  Service: Open Heart  Surgery;  Laterality: N/A;  . Intraoperative transesophageal echocardiogram N/A 10/10/2013    Procedure: INTRAOPERATIVE TRANSESOPHAGEAL ECHOCARDIOGRAM;  Surgeon: Gaye Pollack, MD;  Location: [facility] OR;  Service: Open Heart Surgery;  Laterality: N/A;  . Left and right heart catheterization with coronary angiogram N/A 08/12/2013    Procedure: LEFT AND RIGHT HEART CATHETERIZATION WITH CORONARY ANGIOGRAM;  Surgeon: Burnell Blanks, MD;  Location: [facility] CATH LAB;  Service: Cardiovascular;  Laterality: N/A;     Current Outpatient Prescriptions  Medication Sig Dispense Refill  . amLODipine (NORVASC) 10 MG tablet  Take 1 tablet (10 mg total) by mouth daily. 90 tablet 1  . aspirin EC 325 MG EC tablet Take 1 tablet (325 mg total) by mouth daily. 30 tablet 0  . lovastatin (MEVACOR) 20 MG tablet Take 1 tablet (20 mg total) by mouth at bedtime. 90 tablet 1  . triamterene-hydrochlorothiazide (MAXZIDE-25) 37.5-25 MG tablet Take 1 tablet by mouth daily. 90 tablet 1   No current facility-administered medications for this visit.    Allergies:   Ace inhibitors    Social History:  The patient  reports that she has never smoked. She has never used smokeless tobacco. She reports that she does not drink alcohol or use illicit drugs.   Family History:  The patient's family history is negative for CAD.    ROS:  Please see the history of present illness.   Otherwise, review of systems are positive for chest pain, localized back pain and pounding sensation of heart.   All other systems are reviewed and negative.    PHYSICAL EXAM: VS:  BP 164/72 mmHg  Pulse 70  Temp(Src) 99.1 F (37.3 C)  Ht 5\' 1"  (1.549 m)  Wt 162 lb (73.483 kg)  BMI 30.63 kg/m2 , BMI Body mass index is 30.63 kg/(m^2). GEN: Well nourished, well developed, in no acute distress HEENT: normal Neck: no JVD, carotid bruits, or masses Cardiac: RRR; no rubs, or gallops,no edema  3/6 systolic murmur at LUSB Respiratory:  clear to auscultation bilaterally, normal work of breathing GI: soft, nontender, nondistended, + BS MS: no deformity or atrophy Skin: warm and dry, no rash Neuro:  Strength and sensation are intact Psych: euthymic mood, full affect   EKG:  EKG is ordered today.   Recent Labs: No results found for requested labs within last 365 days.    Lipid Panel    Component Value Date/Time   CHOL 170 12/05/2013 0940   TRIG 88.0 12/05/2013 0940   HDL 49.40 12/05/2013 0940   CHOLHDL 3 12/05/2013 0940   VLDL 17.6 12/05/2013 0940   LDLCALC 103* 12/05/2013 0940      Wt Readings from Last 3 Encounters:  10/04/15 162 lb (73.483 kg)   01/25/15 165 lb 12.8 oz (75.206 kg)  12/29/14 165 lb 12 oz (75.184 kg)      Other studies Reviewed: Additional studies/ records that were reviewed today include:   CT surgery note in Jan 2015, last office note  Echo 01/26/2015 LV EF: 65% -  70%  ------------------------------------------------------------------- Indications:   I35.9 Aortic Valve Disorder.  ------------------------------------------------------------------- History:  PMH: Acquired from the patient and from the patient&'s chart. PMH: CAD. Chest Pain. Post Op-Atrial Fibrillation. Risk factors: Hypertension. Dyslipidemia.  ------------------------------------------------------------------- Study Conclusions  - Left ventricle: The cavity size was normal. There was mild concentric hypertrophy. Systolic function was vigorous. The estimated ejection fraction was in the range of 65% to 70%. Wall motion was normal; there were no regional wall motion abnormalities. Doppler  parameters are consistent with abnormal left ventricular relaxation (grade 1 diastolic dysfunction). There was no evidence of elevated ventricular filling pressure by Doppler parameters. - Aortic valve: Mechanical prosthesis sits well in the aortic position. There is no central regurgitation or paravalvular leak. Normal transaortic gradients. Mean gradient (S): 17 mm Hg. Peak gradient 25 mmHg. - Aortic root: The aortic root was normal in size. - Mitral valve: Mildly thickened leaflets . There was trivial regurgitation. - Right ventricle: The cavity size was normal. Wall thickness was normal. Systolic function was normal. - Right atrium: The atrium was normal in size. - Tricuspid valve: There was mild regurgitation. - Pulmonary arteries: Systolic pressure was within the normal range. - Inferior vena cava: The vessel was normal in size. - Pericardium, extracardiac: There was no pericardial  effusion.    Review of the above records demonstrates:   H/o bioprosthetic AVR and 3v CABG in 10/2013   ASSESSMENT AND PLAN:  1. Chest pressure: somewhat atypical, worse with deep inspiration. Will order trop. If trop negative, will obtain myoview   2. Back pain: clearly musculoskeletal, worse with palpation  3. Uncontrolled HTN: has not taken medication for awhile, will restart BP meds  4. H/o severe AS s/p 41mm Edwards pericardial valve 0000000: loud 3/6 systolic murmur noted at LUSB, will repeat echo today  5. CAD s/p 3v CABG 2015 LIMA to LAD, SVG to AM and PDA   Current medicines are reviewed at length with the patient today.  The patient does not have concerns regarding medicines.  The following changes have been made:  Obtain trop, echo and stress test   Labs/ tests ordered today include:   Orders Placed This Encounter  Procedures  . Troponin I  . Myocardial Perfusion Imaging  . EKG 12-Lead  . Echocardiogram     Disposition:   FU with cardiology PA/NP in 2 weeks  Hilbert Corrigan PA 10/04/2015 2:00 PM    [address], [place], [place]  [address] Phone: [phone]; Fax: [phone]

## 2015-10-11 ENCOUNTER — Telehealth (HOSPITAL_COMMUNITY): Payer: Self-pay | Admitting: *Deleted

## 2015-10-11 NOTE — Telephone Encounter (Signed)
Attempted to call patient regarding upcoming appointment- no answer. Avani Sensabaugh J Cedrick Partain, RN 

## 2015-10-16 ENCOUNTER — Encounter (HOSPITAL_COMMUNITY): Payer: 59

## 2015-10-16 ENCOUNTER — Other Ambulatory Visit (HOSPITAL_COMMUNITY): Payer: 59

## 2015-10-18 ENCOUNTER — Ambulatory Visit: Payer: 59 | Admitting: Cardiology

## 2015-10-24 ENCOUNTER — Encounter: Payer: Self-pay | Admitting: Cardiology

## 2015-11-07 ENCOUNTER — Telehealth (HOSPITAL_COMMUNITY): Payer: Self-pay | Admitting: *Deleted

## 2015-11-07 NOTE — Telephone Encounter (Signed)
Attempted to call patient for upcoming appointment- no answer. Hubbard Robinson, RN

## 2015-11-09 ENCOUNTER — Other Ambulatory Visit (HOSPITAL_COMMUNITY): Payer: 59

## 2015-11-09 ENCOUNTER — Encounter (HOSPITAL_COMMUNITY): Payer: Self-pay

## 2016-05-09 ENCOUNTER — Ambulatory Visit: Payer: 59 | Admitting: Nurse Practitioner

## 2016-05-29 NOTE — Progress Notes (Signed)
Cardiology Office Note   Date:  05/30/2016   ID:  Laurie Lambert, DOB 06-Sep-1956, MRN [id number]  PCP:  Unice Cobble, MD  Cardiologist:  DR. [place]   Chief Complaint  Patient presents with  . Coronary Artery Disease    syncope        History of Present Illness: Laurie Lambert is a 60 y.o. female who presents for CAD.  Previous chest pain.   She speaks no [place], we used Electronics engineer. Her husband that was with her understands but he also used Optometrist by phone.  She has a history of severe AS, CAD s/p CABG and bioprosthetic aVR in January 2015 c/b post op afib, HTN and HLD. She was initially seen by Dr. Angelena Form in October 2014 with complaint of chest discomfort. Echocardiogram showed severe ALS. She underwent diagnostic cardiac catheterization which showed severe AS with mean gradient 46, 50% mid LAD stenosis, large dominant RCA with long 99% stenosis in mid vessel followed by 99% stenosis in distal vessel. She underwent bioprosthetic AVR with 21 mm Edwards pericardial valve and a three-vessel CABG with LIMA to LAD, SVG to AM and PDA. She was last seen in the office on 01/25/2015 with complaint of chest discomfort. Echocardiogram was repeated in Apr 2016 which shows EF Q000111Q, grade 1 diastolic dysfunction, aortic valve well seated in aortic position with no central regurgitation or perivalvular leak, trivial MR, normal RVSP, mild TR. Unfortunately she did not do her stress test.  Today no chest pain and no SOB.  She does complain of rapid HR mostly at night.  But she is able to fall asleep with the racing, she may have some during the day but is not as aware of this.  She also complains of syncope, only out for a few seconds.  No injuries and no loss of bowel or bladder.  She does have dysuria at times and abd pain - will check U/A.  I asked why she did not have previous tests that were ordered. But they could not give an answer.    Past Medical History:  Diagnosis Date  .  Aortic stenosis    a. s/p bioprosthetic AVR 2014;  b. Echo (2/15):  Mild focal basal hypertrophy of the septum, EF 60-65%, normal wall motion, grade 2 diastolic dysfunction,  AVR okay (mean gradient 12 mm Hg), mild MR  . Coronary artery disease    a. LHC (11/14):  Mid LAD 50%, mid RCA 99%, distal RCA 99% >> s/p CABG  . Edema leg   . Hyperlipemia   . Hypertension     Past Surgical History:  Procedure Laterality Date  . ANKLE SURGERY Right    25 years ago  . AORTIC VALVE REPLACEMENT N/A 10/10/2013   Procedure: AORTIC VALVE REPLACEMENT (AVR);  Surgeon: Gaye Pollack, MD;  Location: [facility];  Service: Open Heart Surgery;  Laterality: N/A;  . Breast cyst removal    . CARDIAC CATHETERIZATION    . CORONARY ARTERY BYPASS GRAFT N/A 10/10/2013   Procedure: CORONARY ARTERY BYPASS GRAFTING TIMES THREE USING LEFT INTERNAL MAMMARY ARTERY AND RIGHT LEG GREATER SAPHENOUS VEIN HARVESTED ENDOSCOPICALLY;  Surgeon: Gaye Pollack, MD;  Location: [facility];  Service: Open Heart Surgery;  Laterality: N/A;  . EYE SURGERY     lasik  . INTRAOPERATIVE TRANSESOPHAGEAL ECHOCARDIOGRAM N/A 10/10/2013   Procedure: INTRAOPERATIVE TRANSESOPHAGEAL ECHOCARDIOGRAM;  Surgeon: Gaye Pollack, MD;  Location: [facility] OR;  Service: Open Heart Surgery;  Laterality: N/A;  . LEFT AND  RIGHT HEART CATHETERIZATION WITH CORONARY ANGIOGRAM N/A 08/12/2013   Procedure: LEFT AND RIGHT HEART CATHETERIZATION WITH CORONARY ANGIOGRAM;  Surgeon: Burnell Blanks, MD;  Location: [facility] CATH LAB;  Service: Cardiovascular;  Laterality: N/A;  . TEE WITHOUT CARDIOVERSION N/A 08/12/2013   Procedure: TRANSESOPHAGEAL ECHOCARDIOGRAM (TEE);  Surgeon: Dorothy Spark, MD;  Location: [facility];  Service: Cardiovascular;  Laterality: N/A;  Spanish Interpreter needed  . TUBAL LIGATION    . UTERINE FIBROID SURGERY  2010  . VAGINAL DELIVERY     x4     Current Outpatient Prescriptions  Medication Sig Dispense Refill  . amLODipine (NORVASC) 10 MG tablet Take 1  tablet (10 mg total) by mouth daily. 90 tablet 1  . aspirin EC 325 MG EC tablet Take 1 tablet (325 mg total) by mouth daily. 30 tablet 0  . lovastatin (MEVACOR) 20 MG tablet Take 1 tablet (20 mg total) by mouth at bedtime. 90 tablet 1  . triamterene-hydrochlorothiazide (MAXZIDE-25) 37.5-25 MG tablet Take 1 tablet by mouth daily. 90 tablet 1   No current facility-administered medications for this visit.     Allergies:   Ace inhibitors    Social History:  The patient  reports that she has never smoked. She has never used smokeless tobacco. She reports that she does not drink alcohol or use drugs.   Family History:    Pt does not know family hx but no heart disease that she is aware of.    ROS:  General:no colds or fevers, no weight changes Skin:no rashes or ulcers HEENT:no blurred vision, no congestion CV:see HPI PUL:see HPI GI:no diarrhea constipation or melena, no indigestion--occ abd pain GU:no hematuria, + dysuria MS:no joint pain, no claudication Neuro:+ syncope, no lightheadedness Endo:no diabetes, no thyroid disease  Wt Readings from Last 3 Encounters:  05/30/16 168 lb (76.2 kg)  10/04/15 162 lb (73.5 kg)  01/25/15 165 lb 12.8 oz (75.2 kg)     PHYSICAL EXAM: VS:  BP 138/70   Pulse (!) 52   Ht 5\' 3"  (1.6 m)   Wt 168 lb (76.2 kg)   BMI 29.76 kg/m  , BMI Body mass index is 29.76 kg/m. General:Pleasant affect, NAD Skin:Warm and dry, brisk capillary refill HEENT:normocephalic, sclera clear, mucus membranes moist Neck:supple, no JVD, +radiation of murmur to carotids   Heart:S1S2 RRR with [id number] systolic murmur, no gallup, rub or click Lungs:clear without rales, rhonchi, or wheezes [id number], non tender, + BS, do not palpate liver spleen or masses Ext:no lower ext edema, 2+ pedal pulses, 2+ radial pulses Neuro:alert and oriented X 3, MAE, follows commands, + facial symmetry    EKG:  EKG is ordered today. The ekg ordered today demonstrates SB at 59 no acute changes  from previous EKGs.     Recent Labs: No results found for requested labs within last 8760 hours.    Lipid Panel    Component Value Date/Time   CHOL 170 12/05/2013 0940   TRIG 88.0 12/05/2013 0940   HDL 49.40 12/05/2013 0940   CHOLHDL 3 12/05/2013 0940   VLDL 17.6 12/05/2013 0940   LDLCALC 103 (H) 12/05/2013 0940       Other studies Reviewed: Additional studies/ records that were reviewed today include:   ECHO: 01/26/15 Study Conclusions  - Left ventricle: The cavity size was normal. There was mild   concentric hypertrophy. Systolic function was vigorous. The   estimated ejection fraction was in the range of 65% to 70%. Wall   motion was normal; there  were no regional wall motion   abnormalities. Doppler parameters are consistent with abnormal   left ventricular relaxation (grade 1 diastolic dysfunction).   There was no evidence of elevated ventricular filling pressure by   Doppler parameters. - Aortic valve: Mechanical prosthesis sits well in the aortic   position. There is no central regurgitation or paravalvular leak.   Normal transaortic gradients. Mean gradient (S): 17 mm Hg. Peak   gradient 25 mmHg. - Aortic root: The aortic root was normal in size. - Mitral valve: Mildly thickened leaflets . There was trivial   regurgitation. - Right ventricle: The cavity size was normal. Wall thickness was   normal. Systolic function was normal. - Right atrium: The atrium was normal in size. - Tricuspid valve: There was mild regurgitation. - Pulmonary arteries: Systolic pressure was within the normal   range. - Inferior vena cava: The vessel was normal in size. - Pericardium, extracardiac: There was no pericardial effusion.  ASSESSMENT AND PLAN:  1. Syncope brief several seconds.    2. Tachycardia with hx of PAF- will have her wear a monitor for 48 hours to determine rhythm.   3. HTN: controlled  4. H/o severe AS s/p 50mm Edwards pericardial valve 10/2013: loud [id number]  systolic murmur noted  Will have her have echo in 2 months and follow up with Dr. Angelena Form she has not seen MD since 2015 and they are asking to see MD   5. CAD s/p 3v CABG 2015 LIMA to LAD, SVG to AM and PDA  I explained what tests were ordered and follow up. They had no questions.  Current medicines are reviewed with the patient today.  The patient Has no concerns regarding medicines.  The following changes have been made:  See above Labs/ tests ordered today include:see above  Disposition:   FU:  see above  Signed, Cecilie Kicks, NP  05/30/2016 10:21 AM    [facility] [address] [facility], [place], [place], [place] Phone: [phone]; Fax: [phone]

## 2016-05-30 ENCOUNTER — Encounter (INDEPENDENT_AMBULATORY_CARE_PROVIDER_SITE_OTHER): Payer: Self-pay

## 2016-05-30 ENCOUNTER — Encounter: Payer: Self-pay | Admitting: Cardiology

## 2016-05-30 ENCOUNTER — Ambulatory Visit (INDEPENDENT_AMBULATORY_CARE_PROVIDER_SITE_OTHER): Payer: BLUE CROSS/BLUE SHIELD | Admitting: Cardiology

## 2016-05-30 VITALS — BP 138/70 | HR 52 | Ht 63.0 in | Wt 168.0 lb

## 2016-05-30 DIAGNOSIS — I10 Essential (primary) hypertension: Secondary | ICD-10-CM

## 2016-05-30 DIAGNOSIS — R300 Dysuria: Secondary | ICD-10-CM

## 2016-05-30 DIAGNOSIS — R000 Tachycardia, unspecified: Secondary | ICD-10-CM

## 2016-05-30 DIAGNOSIS — I350 Nonrheumatic aortic (valve) stenosis: Secondary | ICD-10-CM

## 2016-05-30 DIAGNOSIS — Z954 Presence of other heart-valve replacement: Secondary | ICD-10-CM | POA: Diagnosis not present

## 2016-05-30 DIAGNOSIS — R55 Syncope and collapse: Secondary | ICD-10-CM

## 2016-05-30 DIAGNOSIS — Z952 Presence of prosthetic heart valve: Secondary | ICD-10-CM

## 2016-05-30 LAB — BASIC METABOLIC PANEL
BUN: 15 mg/dL (ref 7–25)
CALCIUM: 9.2 mg/dL (ref 8.6–10.4)
CO2: 25 mmol/L (ref 20–31)
CREATININE: 0.51 mg/dL (ref 0.50–1.05)
Chloride: 104 mmol/L (ref 98–110)
GLUCOSE: 84 mg/dL (ref 65–99)
Potassium: 3.8 mmol/L (ref 3.5–5.3)
SODIUM: 141 mmol/L (ref 135–146)

## 2016-05-30 LAB — URINALYSIS
BILIRUBIN URINE: NEGATIVE
GLUCOSE, UA: NEGATIVE
HGB URINE DIPSTICK: NEGATIVE
KETONES UR: NEGATIVE
LEUKOCYTES UA: NEGATIVE
Nitrite: NEGATIVE
PROTEIN: NEGATIVE
Specific Gravity, Urine: 1.009 (ref 1.001–1.035)
pH: 7 (ref 5.0–8.0)

## 2016-05-30 LAB — CBC WITH DIFFERENTIAL/PLATELET
BASOS PCT: 0 %
Basophils Absolute: 0 cells/uL (ref 0–200)
EOS ABS: 78 {cells}/uL (ref 15–500)
Eosinophils Relative: 1 %
HCT: 38.8 % (ref 35.0–45.0)
Hemoglobin: 12.8 g/dL (ref 11.7–15.5)
LYMPHS PCT: 35 %
Lymphs Abs: 2730 cells/uL (ref 850–3900)
MCH: 26.5 pg — AB (ref 27.0–33.0)
MCHC: 33 g/dL (ref 32.0–36.0)
MCV: 80.3 fL (ref 80.0–100.0)
MONOS PCT: 5 %
MPV: 11.2 fL (ref 7.5–12.5)
Monocytes Absolute: 390 cells/uL (ref 200–950)
Neutro Abs: 4602 cells/uL (ref 1500–7800)
Neutrophils Relative %: 59 %
PLATELETS: 313 10*3/uL (ref 140–400)
RBC: 4.83 MIL/uL (ref 3.80–5.10)
RDW: 16 % — AB (ref 11.0–15.0)
WBC: 7.8 10*3/uL (ref 3.8–10.8)

## 2016-05-30 NOTE — Patient Instructions (Signed)
Medication Instructions:  Your physician recommends that you continue on your current medications as directed. Please refer to the Current Medication list given to you today.   Labwork: Bmet, Cbc, Urinalysis today  Testing/Procedures: Your physician has recommended that you wear a holter monitor. Holter monitors are medical devices that record the heart's electrical activity. Doctors most often use these monitors to diagnose arrhythmias. Arrhythmias are problems with the speed or rhythm of the heartbeat. The monitor is a small, portable device. You can wear one while you do your normal daily activities. This is usually used to diagnose what is causing palpitations/syncope (passing out). (Please attempt to schedule today)  Your physician has requested that you have an echocardiogram. Echocardiography is a painless test that uses sound waves to create images of your heart. It provides your doctor with information about the size and shape of your heart and how well your heart's chambers and valves are working. This procedure takes approximately one hour. There are no restrictions for this procedure. (To be scheduled in 2 months)  Follow-Up: Your physician recommends that you schedule a follow-up appointment in: 2 months with Dr.Mcalhany   Any Other Special Instructions Will Be Listed Below (If Applicable).     If you need a refill on your cardiac medications before your next appointment, please call your pharmacy.

## 2016-06-02 ENCOUNTER — Ambulatory Visit: Payer: 59 | Admitting: Nurse Practitioner

## 2016-06-06 ENCOUNTER — Ambulatory Visit: Payer: 59 | Admitting: Nurse Practitioner

## 2016-06-06 ENCOUNTER — Ambulatory Visit (INDEPENDENT_AMBULATORY_CARE_PROVIDER_SITE_OTHER): Payer: BLUE CROSS/BLUE SHIELD

## 2016-06-06 DIAGNOSIS — R000 Tachycardia, unspecified: Secondary | ICD-10-CM | POA: Diagnosis not present

## 2016-06-18 ENCOUNTER — Ambulatory Visit: Payer: 59 | Admitting: Cardiology

## 2016-06-19 ENCOUNTER — Telehealth: Payer: Self-pay | Admitting: *Deleted

## 2016-06-19 NOTE — Telephone Encounter (Signed)
Pt notified through interpreter.

## 2016-06-19 NOTE — Telephone Encounter (Signed)
-----   Message from Burnell Blanks, MD sent at 06/19/2016  8:58 AM EDT ----- She has sinus brady and PVCs. She should avoid caffeine and stimulants. Will not add beta blocker for PVCs since she has resting bradycardia. cdm

## 2016-06-26 ENCOUNTER — Other Ambulatory Visit: Payer: Self-pay

## 2016-06-26 ENCOUNTER — Ambulatory Visit (HOSPITAL_COMMUNITY): Payer: BLUE CROSS/BLUE SHIELD | Attending: Internal Medicine

## 2016-06-26 DIAGNOSIS — I350 Nonrheumatic aortic (valve) stenosis: Secondary | ICD-10-CM | POA: Diagnosis not present

## 2016-06-26 DIAGNOSIS — I340 Nonrheumatic mitral (valve) insufficiency: Secondary | ICD-10-CM | POA: Diagnosis not present

## 2016-06-26 DIAGNOSIS — I313 Pericardial effusion (noninflammatory): Secondary | ICD-10-CM | POA: Insufficient documentation

## 2016-08-01 ENCOUNTER — Other Ambulatory Visit (HOSPITAL_COMMUNITY): Payer: BLUE CROSS/BLUE SHIELD

## 2016-08-08 ENCOUNTER — Ambulatory Visit: Payer: BLUE CROSS/BLUE SHIELD | Admitting: Physician Assistant

## 2017-03-04 NOTE — Progress Notes (Addendum)
Cardiology Office Note   Date:  03/05/2017   ID:  Laurie Lambert, DOB 11/20/1955, MRN [id number]  PCP:  Patient, No Pcp Per  Cardiologist:  Dr. Angelena Form     Chief Complaint  Patient presents with  . Coronary Artery Disease    hx AVR      History of Present Illness: Laurie Lambert is a 61 y.o. female who presents for CAD AVR replacement.     She has a history of severe AS, CAD s/p CABG and bioprosthetic aVR in January 2015 c/b post op afib, HTN and HLD. She was initially seen by Dr. Angelena Form in October 2014 with complaint of chest discomfort. Echocardiogram showed severe ALS. She underwent diagnostic cardiac catheterization which showed severe AS with mean gradient 46, 50% mid LAD stenosis, large dominant RCA with long 99% stenosis in mid vessel followed by 99% stenosis in distal vessel. She underwent bioprosthetic AVR with 21 mm Edwards pericardial valve and a three-vessel CABG with LIMA to LAD, SVG to AM and PDA.   Office visit  on 01/25/2015 with complaint of chest discomfort. Echocardiogram was repeated in Apr 2016 which shows EF 01-74%, grade 1 diastolic dysfunction, aortic valve well seated in aortic position with no central regurgitation or perivalvular leak, trivial MR, normal RVSP, mild TR. Unfortunately she did not do her stress test.  Last seen in 2017 with syncope,  Holter with freq PVCs. Echo with EF 55-60 %, G1DD, aortic valve AV prosthesis opens well. Mild MR.  Pt did not keep follow up appts.  Today we used the [employer] computer interpreting system.   Pt denies chest pain or SOB.  No complaints at all she feels well.  She did complain of sore area under her lt breast.  I reviewed her last echo and holter monitor with pt.  She has no complaints of rapid HR today or palpitations.      Past Medical History:  Diagnosis Date  . Aortic stenosis    a. s/p bioprosthetic AVR 2014;  b. Echo (2/15):  Mild focal basal hypertrophy of the septum, EF 60-65%, normal wall motion, grade 2  diastolic dysfunction,  AVR okay (mean gradient 12 mm Hg), mild MR  . Coronary artery disease    a. LHC (11/14):  Mid LAD 50%, mid RCA 99%, distal RCA 99% >> s/p CABG  . Edema leg   . Hyperlipemia   . Hypertension     Past Surgical History:  Procedure Laterality Date  . ANKLE SURGERY Right    25 years ago  . AORTIC VALVE REPLACEMENT N/A 10/10/2013   Procedure: AORTIC VALVE REPLACEMENT (AVR);  Surgeon: Gaye Pollack, MD;  Location: [place];  Service: Open Heart Surgery;  Laterality: N/A;  . Breast cyst removal    . CARDIAC CATHETERIZATION    . CORONARY ARTERY BYPASS GRAFT N/A 10/10/2013   Procedure: CORONARY ARTERY BYPASS GRAFTING TIMES THREE USING LEFT INTERNAL MAMMARY ARTERY AND RIGHT LEG GREATER SAPHENOUS VEIN HARVESTED ENDOSCOPICALLY;  Surgeon: Gaye Pollack, MD;  Location: [place];  Service: Open Heart Surgery;  Laterality: N/A;  . EYE SURGERY     lasik  . INTRAOPERATIVE TRANSESOPHAGEAL ECHOCARDIOGRAM N/A 10/10/2013   Procedure: INTRAOPERATIVE TRANSESOPHAGEAL ECHOCARDIOGRAM;  Surgeon: Gaye Pollack, MD;  Location[facility] OR;  Service: Open Heart Surgery;  Laterality: N/A;  . LEFT AND RIGHT HEART CATHETERIZATION WITH CORONARY ANGIOGRAM N/A 08/12/2013   Procedure: LEFT AND RIGHT HEART CATHETERIZATION WITH CORONARY ANGIOGRAM;  Surgeon: Burnell Blanks, MD;  Location: [facility]  CATH LAB;  Service: Cardiovascular;  Laterality: N/A;  . TEE WITHOUT CARDIOVERSION N/A 08/12/2013   Procedure: TRANSESOPHAGEAL ECHOCARDIOGRAM (TEE);  Surgeon: Dorothy Spark, MD;  Location: [place];  Service: Cardiovascular;  Laterality: N/A;  Spanish Interpreter needed  . TUBAL LIGATION    . UTERINE FIBROID SURGERY  2010  . VAGINAL DELIVERY     x4     Current Outpatient Prescriptions  Medication Sig Dispense Refill  . amLODipine (NORVASC) 10 MG tablet Take 1 tablet (10 mg total) by mouth daily. 90 tablet 1  . aspirin 81 MG tablet Take 1 tablet (81 mg total) by mouth daily.    Marland Kitchen lovastatin (MEVACOR) 20 MG  tablet Take 1 tablet (20 mg total) by mouth at bedtime. 90 tablet 1  . triamterene-hydrochlorothiazide (MAXZIDE-25) 37.5-25 MG tablet Take 1 tablet by mouth daily. 90 tablet 1   No current facility-administered medications for this visit.     Allergies:   Ace inhibitors    Social History:  The patient  reports that she has never smoked. She has never used smokeless tobacco. She reports that she does not drink alcohol or use drugs.   Family History:  The patient's family history is not on file.  No family hx of CAD   ROS:  General:no colds or fevers, no weight changes Skin:no rashes or ulcers HEENT:no blurred vision, no congestion CV:see HPI PUL:see HPI GI:no diarrhea constipation or melena, no indigestion GU:no hematuria, no dysuria MS:no joint pain, no claudication Neuro:no syncope, no lightheadedness Endo:no diabetes, no thyroid disease  Wt Readings from Last 3 Encounters:  03/05/17 168 lb 12.8 oz (76.6 kg)  05/30/16 168 lb (76.2 kg)  10/04/15 162 lb (73.5 kg)     PHYSICAL EXAM: VS:  BP (!) 146/72   Pulse 75   Ht 5\' 3"  (1.6 m)   Wt 168 lb 12.8 oz (76.6 kg)   SpO2 98%   BMI 29.90 kg/m  , BMI Body mass index is 29.9 kg/m. General:Pleasant affect, NAD Skin:Warm and dry, brisk capillary refill HEENT:normocephalic, sclera clear, mucus membranes moist Neck:supple, no JVD, + bruits - radiation of murmur Heart:S1S2 RRR with 2-3 systolic murmur, no gallup, rub or click Lungs:clear without rales, rhonchi, or wheezes DEY:CXKG, non tender, + BS, do not palpate liver spleen or masses Ext:no lower ext edema, 2+ pedal pulses, 2+ radial pulses Neuro:alert and oriented X 3, MAE, follows commands, + facial symmetry Breast Lt lower breast, I did not palpate any mass.    EKG:  EKG is ordered today. The ekg ordered today demonstrates SR biatrial enlargement no EKG changes.     Recent Labs: 05/30/2016: BUN 15; Creat 0.51; Hemoglobin 12.8; Platelets 313; Potassium 3.8; Sodium 141      Lipid Panel    Component Value Date/Time   CHOL 170 12/05/2013 0940   TRIG 88.0 12/05/2013 0940   HDL 49.40 12/05/2013 0940   CHOLHDL 3 12/05/2013 0940   VLDL 17.6 12/05/2013 0940   LDLCALC 103 (H) 12/05/2013 0940       Other studies Reviewed: Additional studies/ records that were reviewed today include: . Holter 06/06/16 Study Highlights   Sinus rhythm with frequent premature ventricular contractions. [id number] during monitoring period) Rare premature atrial contractions (7 during monitoring period) No evidence of atrial fibrillation or supraventricular tachycardia   Echo 06/26/16 Study Conclusions  - Left ventricle: Global Longitudinal Strain = -22.7%. The cavity   size was normal. Wall thickness was normal. Systolic function was   normal. The estimated  ejection fraction was in the range of 55%   to 60%. Doppler parameters are consistent with abnormal left   ventricular relaxation (grade 1 diastolic dysfunction). - Aortic valve: AV prosthesis appears to open well Peak and mean   gradients through the valve are 31 and 19 mm Hg respectively. - Mitral valve: There was mild regurgitation. - Pericardium, extracardiac: A trivial pericardial effusion was   identified.  ECHO 01/26/15 Study Conclusions  - Left ventricle: The cavity size was normal. There was mild   concentric hypertrophy. Systolic function was vigorous. The   estimated ejection fraction was in the range of 65% to 70%. Wall   motion was normal; there were no regional wall motion   abnormalities. Doppler parameters are consistent with abnormal   left ventricular relaxation (grade 1 diastolic dysfunction).   There was no evidence of elevated ventricular filling pressure by   Doppler parameters. - Aortic valve: Mechanical prosthesis sits well in the aortic   position. There is no central regurgitation or paravalvular leak.   Normal transaortic gradients. Mean gradient (S): 17 mm Hg. Peak   gradient 25 mmHg. -  Aortic root: The aortic root was normal in size. - Mitral valve: Mildly thickened leaflets . There was trivial   regurgitation. - Right ventricle: The cavity size was normal. Wall thickness was   normal. Systolic function was normal. - Right atrium: The atrium was normal in size. - Tricuspid valve: There was mild regurgitation. - Pulmonary arteries: Systolic pressure was within the normal   range. - Inferior vena cava: The vessel was normal in size. - Pericardium, extracardiac: There was no pericardial effusion.    ASSESSMENT AND PLAN:  1.  CAD with  Hx CABG no chest pain and no complaints.  Follow up with Dr. Angelena Form in 6 months.    2.  AVR with bioprosthetic valve.  Murmur similar to last year Echo in Sept with increase of gradients somewhat.   3. Minimal carotid disease on carotid dopplers prior to OHS bil. 1-39%  4. Hx of breast cysts, I did not feel any mass of any size in Lt breast but instructed her to see PCP back if it increases or does not go away.  She noted her mammogram is up to date  5. HLD on statin followed by PCP.   6. HTN PCP is following mildly elevated.   Decreased her ASA to 81 mg.     Current medicines are reviewed with the patient today.  The patient Has no concerns regarding medicines.  The following changes have been made:  See above Labs/ tests ordered today include:see above  Disposition:   FU:  see above  Signed, Cecilie Kicks, NP  03/05/2017 1:51 PM    [facility] [place], [place], [place], [place] Phone: [phone]; Fax: [phone]

## 2017-03-05 ENCOUNTER — Ambulatory Visit (INDEPENDENT_AMBULATORY_CARE_PROVIDER_SITE_OTHER): Payer: BLUE CROSS/BLUE SHIELD | Admitting: Cardiology

## 2017-03-05 ENCOUNTER — Encounter: Payer: Self-pay | Admitting: Cardiology

## 2017-03-05 ENCOUNTER — Encounter (INDEPENDENT_AMBULATORY_CARE_PROVIDER_SITE_OTHER): Payer: Self-pay

## 2017-03-05 VITALS — BP 146/72 | HR 75 | Ht 63.0 in | Wt 168.8 lb

## 2017-03-05 DIAGNOSIS — I350 Nonrheumatic aortic (valve) stenosis: Secondary | ICD-10-CM | POA: Diagnosis not present

## 2017-03-05 DIAGNOSIS — Z952 Presence of prosthetic heart valve: Secondary | ICD-10-CM | POA: Diagnosis not present

## 2017-03-05 DIAGNOSIS — I2510 Atherosclerotic heart disease of native coronary artery without angina pectoris: Secondary | ICD-10-CM

## 2017-03-05 DIAGNOSIS — I10 Essential (primary) hypertension: Secondary | ICD-10-CM | POA: Diagnosis not present

## 2017-03-05 MED ORDER — ASPIRIN EC 81 MG PO TBEC: 81.0000 mg | DELAYED_RELEASE_TABLET | Freq: Every day | ORAL | Status: AC

## 2017-03-05 NOTE — Patient Instructions (Signed)
Medication Instructions:  DECREASE aspirin to 81mg  daily  Labwork: NONE  Testing/Procedures: NONE  Follow-Up: Your physician wants you to follow-up in: 6-8 months with Dr. Angelena Form. You will receive a reminder letter in the mail two months in advance. If you don't receive a letter, please call our office to schedule the follow-up appointment.   Any Other Special Instructions Will Be Listed Below (If Applicable).     If you need a refill on your cardiac medications before your next appointment, please call your pharmacy.

## 2017-11-05 ENCOUNTER — Encounter: Payer: Self-pay | Admitting: Physician Assistant

## 2017-11-12 ENCOUNTER — Encounter: Payer: Self-pay | Admitting: Physician Assistant

## 2017-11-12 DIAGNOSIS — I493 Ventricular premature depolarization: Secondary | ICD-10-CM | POA: Insufficient documentation

## 2017-11-12 NOTE — Progress Notes (Signed)
Cardiology Office Note    Date:  11/13/2017  ID:  Laurie Lambert, DOB [birth year], MRN [id number] PCP:  Patient, No Pcp Per  Cardiologist:  Dr. Angelena Form   Chief Complaint: overdue follow-up of CAD, AS  History of Present Illness:  Laurie Lambert is a 62 y.o. female with history of severe AS, CAD s/p CABG and bioprosthetic AVR in January 2015 c/b post op afib, HTN and HLD who presents for routine follow-up. Following her bypass surgery in 2016 she had chest pain at which time 2D echo was updated and was nonacute. She did not comply with ordered stress testing. She was seen in 2017 for episode of syncope at which time echo 06/26/16 showed EF 55-60%, AV gradient opens well, grade 1 DD, mild MR, trivial pericardial effusion. 48-Hour holter showed NSR with frequent PVCs, rare PACs. (Prior duplex 2015 showed 1-39% BICA.) She did not keep subsequent follow-up appointments until being seen again 02/2017 at which time she was doing well. Last labs were in 2017 with normal CBC, normal BMET.  She returns for follow-up today feeling great. She has no chest pain or SOB. She is very active. She initially reported taking her medicine but wasn't sure which ones. When we called her pharmacy it turns out she actually has been out of amlodipine and triamterene HCTZ since 06/2017 - she then admitted to being out for several months. She thinks she's only taking the aspirin and cholesterol medicine. Repeat BP by me was 160/82. She has history of ACEI with cough, intolerance to losartan (?made her BP go up per notes), and at some point between 2015 and 2016, metoprolol fell off her list for unclear reasons. No recent palpitations. Visit was performed with tele interpreter.   Past Medical History:  Diagnosis Date  . Aortic stenosis    a. s/p bioprosthetic AVR 2014;  b. Echo (2/15):  Mild focal basal hypertrophy of the septum, EF 60-65%, normal wall motion, grade 2 diastolic dysfunction,  AVR okay (mean gradient 12 mm Hg),  mild MR  . Coronary artery disease    a. LHC (11/14):  Mid LAD 50%, mid RCA 99%, distal RCA 99% >> s/p CABG  . Edema leg   . Hyperlipemia   . Hypertension   . Premature atrial contractions   . PVC's (premature ventricular contractions)   . Syncope    a. 2017:48-Hour holter showed NSR with frequent PVCs, rare PACs.     Past Surgical History:  Procedure Laterality Date  . ANKLE SURGERY Right    25 years ago  . AORTIC VALVE REPLACEMENT N/A 10/10/2013   Procedure: AORTIC VALVE REPLACEMENT (AVR);  Surgeon: Gaye Pollack, MD;  Location: [place];  Service: Open Heart Surgery;  Laterality: N/A;  . Breast cyst removal    . CARDIAC CATHETERIZATION    . CORONARY ARTERY BYPASS GRAFT N/A 10/10/2013   Procedure: CORONARY ARTERY BYPASS GRAFTING TIMES THREE USING LEFT INTERNAL MAMMARY ARTERY AND RIGHT LEG GREATER SAPHENOUS VEIN HARVESTED ENDOSCOPICALLY;  Surgeon: Gaye Pollack, MD;  Location: [place];  Service: Open Heart Surgery;  Laterality: N/A;  . EYE SURGERY     lasik  . INTRAOPERATIVE TRANSESOPHAGEAL ECHOCARDIOGRAM N/A 10/10/2013   Procedure: INTRAOPERATIVE TRANSESOPHAGEAL ECHOCARDIOGRAM;  Surgeon: Gaye Pollack, MD;  [facility];  Service: Open Heart Surgery;  Laterality: N/A;  . LEFT AND RIGHT HEART CATHETERIZATION WITH CORONARY ANGIOGRAM N/A 08/12/2013   Procedure: LEFT AND RIGHT HEART CATHETERIZATION WITH CORONARY ANGIOGRAM;  Surgeon: Burnell Blanks, MD;  Location: [place] CATH LAB;  Service: Cardiovascular;  Laterality: N/A;  . TEE WITHOUT CARDIOVERSION N/A 08/12/2013   Procedure: TRANSESOPHAGEAL ECHOCARDIOGRAM (TEE);  Surgeon: Dorothy Spark, MD;  Location: [place];  Service: Cardiovascular;  Laterality: N/A;  Spanish Interpreter needed  . TUBAL LIGATION    . UTERINE FIBROID SURGERY  2010  . VAGINAL DELIVERY     x4    Current Medications: Current Meds  Medication Sig  . amLODipine (NORVASC) 10 MG tablet Take 1 tablet (10 mg total) by mouth daily.  Marland Kitchen aspirin 81 MG tablet Take  1 tablet (81 mg total) by mouth daily.  Marland Kitchen lovastatin (MEVACOR) 20 MG tablet Take 1 tablet (20 mg total) by mouth at bedtime.  . triamterene-hydrochlorothiazide (MAXZIDE-25) 37.5-25 MG tablet Take 1 tablet by mouth daily.     Allergies:   Ace inhibitors   Social History   Socioeconomic History  . Marital status: Married    Spouse name: None  . Number of children: 4  . Years of education: None  . Highest education level: None  Social Needs  . Financial resource strain: None  . Food insecurity - worry: None  . Food insecurity - inability: None  . Transportation needs - medical: None  . Transportation needs - non-medical: None  Occupational History  . Occupation: Architect  Tobacco Use  . Smoking status: Never Smoker  . Smokeless tobacco: Never Used  Substance and Sexual Activity  . Alcohol use: No  . Drug use: No  . Sexual activity: None  Other Topics Concern  . None  Social History Narrative  . None     Family History:  Family History  Problem Relation Age of Onset  . CAD Neg Hx     ROS:   Please see the history of present illness.  All other systems are reviewed and otherwise negative.    PHYSICAL EXAM:   VS:  BP (!) 144/78   Pulse 71   Ht 5\' 3"  (1.6 m)   Wt 169 lb 6.4 oz (76.8 kg)   SpO2 94%   BMI 30.01 kg/m   BMI: Body mass index is 30.01 kg/m. GEN: Well nourished, well developed hispanic obese F, in no acute distress  HEENT: normocephalic, atraumatic Neck: no JVD, carotid bruits, or masses Cardiac: RRR; 2/6 SEM throughout precordium, rubs, or gallops, no edema  Respiratory:  clear to auscultation bilaterally, normal work of breathing GI: soft, nontender, nondistended, + BS MS: no deformity or atrophy  Skin: warm and dry, no rash Neuro:  Alert and Oriented x 3, Strength and sensation are intact, follows commands Psych: euthymic mood, full affect  Wt Readings from Last 3 Encounters:  11/13/17 169 lb 6.4 oz (76.8 kg)  03/05/17 168 lb 12.8 oz  (76.6 kg)  05/30/16 168 lb (76.2 kg)      Studies/Labs Reviewed:   EKG:  EKG was ordered today and personally reviewed by me and demonstrates NSR 71bpm, posisbl left atrial enlargemnet, nonspecific ST-T changes, no acute change from prior.  Recent Labs: No results found for requested labs within last 8760 hours.   Lipid Panel    Component Value Date/Time   CHOL 170 12/05/2013 0940   TRIG 88.0 12/05/2013 0940   HDL 49.40 12/05/2013 0940   CHOLHDL 3 12/05/2013 0940   VLDL 17.6 12/05/2013 0940   LDLCALC 103 (H) 12/05/2013 0940    Additional studies/ records that were reviewed today include: Summarized above    ASSESSMENT & PLAN:   1. CAD -  feeling well. Continue aspirin and statin. Will check lipids today (they will be nonfasting but this is likely our best chance to obtain as her previous follow-up has been spotty). Anticipate escalation of statin if CMET is OK given her known CAD. She is not on a beta blocker for unclear reasons. She does have history of various intolerances in the past and discordant follow-up. The patient is unable to tell me what happened with the metoprolol. Will resume previous regimen (amlodipine & triamterene HCTZ) since she had tolerated this, and follow for now. Consideration could be given to resuming BB if BP remains suboptimal in followup. I offered her to f/u here or at PCP. She has f/u coming up with primary care and prefers to see them back for a BP check. I advised the ideal timeframe would be in about a week. 2. AS s/p AVR - asymptomatic. SBE prophylaxis reviewed today and standing rx for amoxicillin placed on file. Reviewed instructions with interpreter present. 3. HTN - as above, resume meds. Check TSH and lytes/Cr. 4. Hyperlipidemia - as above, checking lipids and anticipate statin titration. 5. PVCs - quiescent by symptoms.   Disposition: F/u with Dr. Angelena Form in 6 months. One of our nursing staff who speaks Spanish was able to help [place] compile  a more helpful AVS.   Medication Adjustments/Labs and Tests Ordered: Current medicines are reviewed at length with the patient today.  Concerns regarding medicines are outlined above. Medication changes, Labs and Tests ordered today are summarized above and listed in the Patient Instructions accessible in Encounters.   Signed, Charlie Pitter, PA-C  11/13/2017 9:28 AM    [facility] [address] [place], [place], [place]  [address] Phone: [phone]; Fax: [phone]

## 2017-11-13 ENCOUNTER — Ambulatory Visit: Payer: BLUE CROSS/BLUE SHIELD | Admitting: Physician Assistant

## 2017-11-13 ENCOUNTER — Encounter: Payer: Self-pay | Admitting: Physician Assistant

## 2017-11-13 VITALS — BP 160/82 | HR 71 | Ht 63.0 in | Wt 169.4 lb

## 2017-11-13 DIAGNOSIS — E785 Hyperlipidemia, unspecified: Secondary | ICD-10-CM | POA: Diagnosis not present

## 2017-11-13 DIAGNOSIS — I10 Essential (primary) hypertension: Secondary | ICD-10-CM

## 2017-11-13 DIAGNOSIS — I493 Ventricular premature depolarization: Secondary | ICD-10-CM

## 2017-11-13 DIAGNOSIS — I350 Nonrheumatic aortic (valve) stenosis: Secondary | ICD-10-CM | POA: Diagnosis not present

## 2017-11-13 DIAGNOSIS — Z952 Presence of prosthetic heart valve: Secondary | ICD-10-CM | POA: Diagnosis not present

## 2017-11-13 DIAGNOSIS — I2510 Atherosclerotic heart disease of native coronary artery without angina pectoris: Secondary | ICD-10-CM | POA: Diagnosis not present

## 2017-11-13 MED ORDER — AMLODIPINE BESYLATE 10 MG PO TABS: 10.0000 mg | ORAL_TABLET | Freq: Every day | ORAL | 1 refills | Status: DC

## 2017-11-13 MED ORDER — AMOXICILLIN 500 MG PO TABS: ORAL_TABLET | ORAL | 1 refills | Status: DC

## 2017-11-13 MED ORDER — TRIAMTERENE-HCTZ 37.5-25 MG PO TABS: 1.0000 | ORAL_TABLET | Freq: Every day | ORAL | 1 refills | Status: DC

## 2017-11-13 NOTE — Patient Instructions (Addendum)
Medication Instructions:   1.  TOME Amlodipine 10 mg: 1 tableta cada dia 2.  TOME Triamterene-HCTZ 37.5-25 mg: 1 tableta cada dia 3.  TOME Amoxicillin 500 mg (SOLO CUANDO TIENE VISITA AL DENTISTA): TOME 4 tabletas una hora antes de ir al dentista  Labwork: HOY:  CMET, CBC, TSH, & LIPID  Testing/Procedures: Hollice Gong  Follow-Up: Necesita una cita con DR. MCALHANY EN 6 MESES. Usted va a recibir Psychologist, occupational correo para [employer] meses antes de la cita.  Any Other Special Instructions Will Be Listed Below (If Applicable). Usted necisita hacer cita con su doctor regular en una semana para chequiar la presion     If you need a refill on your cardiac medications before your next appointment, please call your pharmacy.  Endocarditis Information  You may be at risk for developing endocarditis since you have  an artificial heart valve  or a repaired heart valve. Endocarditis is an infection of the lining of the heart or heart valves.   Certain surgical and dental procedures may put you at risk,  such as teeth cleaning or other dental procedures or any surgery involving the respiratory, urinary, gastrointestinal tract, gallbladder or prostate.   Notify your doctor or dentist before having any invasive procedures. You will need to take antibiotics before certain procedures.   To prevent endocarditis, maintain good oral health. Seek prompt medical attention for any mouth/gum, skin or urinary tract infections.

## 2017-11-14 LAB — CBC
HEMOGLOBIN: 12.1 g/dL (ref 11.1–15.9)
Hematocrit: 36.7 % (ref 34.0–46.6)
MCH: 26.7 pg (ref 26.6–33.0)
MCHC: 33 g/dL (ref 31.5–35.7)
MCV: 81 fL (ref 79–97)
Platelets: 333 10*3/uL (ref 150–379)
RBC: 4.53 x10E6/uL (ref 3.77–5.28)
RDW: 15.8 % — ABNORMAL HIGH (ref 12.3–15.4)
WBC: 7.1 10*3/uL (ref 3.4–10.8)

## 2017-11-14 LAB — LIPID PANEL
CHOLESTEROL TOTAL: 184 mg/dL (ref 100–199)
Chol/HDL Ratio: 2.9 ratio (ref 0.0–4.4)
HDL: 64 mg/dL (ref >39)
LDL Calculated: 104 mg/dL — ABNORMAL HIGH (ref 0–99)
Triglycerides: 79 mg/dL (ref 0–149)
VLDL CHOLESTEROL CAL: 16 mg/dL (ref 5–40)

## 2017-11-14 LAB — COMPREHENSIVE METABOLIC PANEL
ALBUMIN: 4.5 g/dL (ref 3.6–4.8)
ALK PHOS: 86 IU/L (ref 39–117)
ALT: 21 IU/L (ref 0–32)
AST: 25 IU/L (ref 0–40)
Albumin/Globulin Ratio: 1.5 (ref 1.2–2.2)
BUN/Creatinine Ratio: 35 — ABNORMAL HIGH (ref 12–28)
BUN: 23 mg/dL (ref 8–27)
Bilirubin Total: 0.2 mg/dL (ref 0.0–1.2)
CO2: 25 mmol/L (ref 20–29)
CREATININE: 0.66 mg/dL (ref 0.57–1.00)
Calcium: 9.5 mg/dL (ref 8.7–10.3)
Chloride: 99 mmol/L (ref 96–106)
GFR calc Af Amer: 111 mL/min/{1.73_m2} (ref >59)
GFR calc non Af Amer: 96 mL/min/{1.73_m2} (ref >59)
Globulin, Total: 3 g/dL (ref 1.5–4.5)
Glucose: 96 mg/dL (ref 65–99)
Potassium: 3.8 mmol/L (ref 3.5–5.2)
Sodium: 140 mmol/L (ref 134–144)
Total Protein: 7.5 g/dL (ref 6.0–8.5)

## 2017-11-14 LAB — TSH: TSH: 0.872 u[IU]/mL (ref 0.450–4.500)

## 2017-11-30 ENCOUNTER — Encounter: Payer: Self-pay | Admitting: *Deleted

## 2017-12-17 ENCOUNTER — Ambulatory Visit: Payer: BLUE CROSS/BLUE SHIELD | Admitting: Medical

## 2018-05-08 ENCOUNTER — Other Ambulatory Visit: Payer: Self-pay | Admitting: Physician Assistant

## 2018-06-06 ENCOUNTER — Other Ambulatory Visit: Payer: Self-pay | Admitting: Physician Assistant

## 2018-06-28 ENCOUNTER — Ambulatory Visit: Payer: BLUE CROSS/BLUE SHIELD | Admitting: Cardiovascular Disease

## 2018-06-28 ENCOUNTER — Encounter (INDEPENDENT_AMBULATORY_CARE_PROVIDER_SITE_OTHER): Payer: Self-pay

## 2018-06-28 ENCOUNTER — Encounter: Payer: Self-pay | Admitting: Cardiovascular Disease

## 2018-06-28 VITALS — BP 122/60 | HR 76 | Ht 63.0 in | Wt 166.8 lb

## 2018-06-28 DIAGNOSIS — I350 Nonrheumatic aortic (valve) stenosis: Secondary | ICD-10-CM | POA: Diagnosis not present

## 2018-06-28 DIAGNOSIS — E785 Hyperlipidemia, unspecified: Secondary | ICD-10-CM

## 2018-06-28 DIAGNOSIS — I2510 Atherosclerotic heart disease of native coronary artery without angina pectoris: Secondary | ICD-10-CM | POA: Diagnosis not present

## 2018-06-28 DIAGNOSIS — I10 Essential (primary) hypertension: Secondary | ICD-10-CM

## 2018-06-28 MED ORDER — AMLODIPINE BESYLATE 10 MG PO TABS: 10.0000 mg | ORAL_TABLET | Freq: Every day | ORAL | 3 refills | Status: AC

## 2018-06-28 MED ORDER — TRIAMTERENE-HCTZ 37.5-25 MG PO CAPS: ORAL_CAPSULE | ORAL | 3 refills | Status: DC

## 2018-06-28 MED ORDER — LOVASTATIN 20 MG PO TABS: 20.0000 mg | ORAL_TABLET | Freq: Every day | ORAL | 3 refills | Status: DC

## 2018-06-28 MED ORDER — AMOXICILLIN 500 MG PO TABS: ORAL_TABLET | ORAL | 3 refills | Status: AC

## 2018-06-28 NOTE — Progress Notes (Signed)
Chief Complaint  Patient presents with  . Follow-up    CAD   History of Present Illness: 62 yo female with history of aortic stenosis s/p AVR, CAD s/p CABG, HTN, HLD and PVCs who is here today for cardiac follow up. I met her in 2014 during evaluation for a cardiac murmur. She was found to have severe AS by echo and CAD by cath and underwent CABG with AVR in January 2015. (LIMA to LAD and SVG to acute marginal and PDA). This was complicated by post op atrial fibrillation. She was seen in our office in 2017 following an episode of syncope. Echo September 2017 with LVEF=55-60%, normally functioning AVR. 48 hour cardiac monitor with PVCs. She has had a cough with Ace-inh therapy and reported intolerance to losartan.   She is here today for follow up. The patient denies any chest pain, dyspnea, palpitations, lower extremity edema, orthopnea, PND, dizziness, near syncope or syncope. She feels great.   Primary Care Physician: Patient, No Pcp Per ? In [place], pt cannot recall name  Past Medical History:  Diagnosis Date  . Aortic stenosis    a. s/p bioprosthetic AVR 2014;  b. Echo (2/15):  Mild focal basal hypertrophy of the septum, EF 60-65%, normal wall motion, grade 2 diastolic dysfunction,  AVR okay (mean gradient 12 mm Hg), mild MR  . Coronary artery disease    a. LHC (11/14):  Mid LAD 50%, mid RCA 99%, distal RCA 99% >> s/p CABG  . Edema leg   . Hyperlipemia   . Hypertension   . Premature atrial contractions   . PVC's (premature ventricular contractions)   . Syncope    a. 2017:48-Hour holter showed NSR with frequent PVCs, rare PACs.     Past Surgical History:  Procedure Laterality Date  . ANKLE SURGERY Right    25 years ago  . AORTIC VALVE REPLACEMENT N/A 10/10/2013   Procedure: AORTIC VALVE REPLACEMENT (AVR);  Surgeon: Gaye Pollack, MD;  Location: [facility];  Service: Open Heart Surgery;  Laterality: N/A;  . Breast cyst removal    . CARDIAC CATHETERIZATION    . CORONARY ARTERY  BYPASS GRAFT N/A 10/10/2013   Procedure: CORONARY ARTERY BYPASS GRAFTING TIMES THREE USING LEFT INTERNAL MAMMARY ARTERY AND RIGHT LEG GREATER SAPHENOUS VEIN HARVESTED ENDOSCOPICALLY;  Surgeon: Gaye Pollack, MD;  Location: [facility];  Service: Open Heart Surgery;  Laterality: N/A;  . EYE SURGERY     lasik  . INTRAOPERATIVE TRANSESOPHAGEAL ECHOCARDIOGRAM N/A 10/10/2013   Procedure: INTRAOPERATIVE TRANSESOPHAGEAL ECHOCARDIOGRAM;  Surgeon: Gaye Pollack, MD;  Location: [facility] OR;  Service: Open Heart Surgery;  Laterality: N/A;  . LEFT AND RIGHT HEART CATHETERIZATION WITH CORONARY ANGIOGRAM N/A 08/12/2013   Procedure: LEFT AND RIGHT HEART CATHETERIZATION WITH CORONARY ANGIOGRAM;  Surgeon: Burnell Blanks, MD;  Location: [facility] CATH LAB;  Service: Cardiovascular;  Laterality: N/A;  . TEE WITHOUT CARDIOVERSION N/A 08/12/2013   Procedure: TRANSESOPHAGEAL ECHOCARDIOGRAM (TEE);  Surgeon: Dorothy Spark, MD;  Location: [facility];  Service: Cardiovascular;  Laterality: N/A;  Spanish Interpreter needed  . TUBAL LIGATION    . UTERINE FIBROID SURGERY  2010  . VAGINAL DELIVERY     x4    Current Outpatient Medications  Medication Sig Dispense Refill  . amLODipine (NORVASC) 10 MG tablet Take 1 tablet (10 mg total) by mouth daily. 90 tablet 3  . aspirin 81 MG tablet Take 1 tablet (81 mg total) by mouth daily.    Marland Kitchen lovastatin (MEVACOR) 20  MG tablet Take 1 tablet (20 mg total) by mouth at bedtime. 90 tablet 3  . triamterene-hydrochlorothiazide (DYAZIDE) 37.5-25 MG capsule TAKE 1 TABLET BY MOUTH DAILY 90 capsule 3  . amoxicillin (AMOXIL) 500 MG tablet Take 4 tablets by mouth one hour prior to dental appointments 4 tablet 3   No current facility-administered medications for this visit.     Allergies  Allergen Reactions  . Ace Inhibitors Cough    cough    Social History   Socioeconomic History  . Marital status: Married    Spouse name: Not on file  . Number of children: 4  . Years of education: Not on  file  . Highest education level: Not on file  Occupational History  . Occupation: Barista  . Financial resource strain: Not on file  . Food insecurity:    Worry: Not on file    Inability: Not on file  . Transportation needs:    Medical: Not on file    Non-medical: Not on file  Tobacco Use  . Smoking status: Never Smoker  . Smokeless tobacco: Never Used  Substance and Sexual Activity  . Alcohol use: No  . Drug use: No  . Sexual activity: Not on file  Lifestyle  . Physical activity:    Days per week: Not on file    Minutes per session: Not on file  . Stress: Not on file  Relationships  . Social connections:    Talks on phone: Not on file    Gets together: Not on file    Attends religious service: Not on file    Active member of club or organization: Not on file    Attends meetings of clubs or organizations: Not on file    Relationship status: Not on file  . Intimate partner violence:    Fear of current or ex partner: Not on file    Emotionally abused: Not on file    Physically abused: Not on file    Forced sexual activity: Not on file  Other Topics Concern  . Not on file  Social History Narrative  . Not on file    Family History  Problem Relation Age of Onset  . CAD Neg Hx     Review of Systems:  As stated in the HPI and otherwise negative.   BP 122/60   Pulse 76   Ht _0  (1.6 m)   Wt 166 lb 12.8 oz (75.7 kg)   SpO2 97%   BMI 29.55 kg/m   Physical Examination: General: Well developed, well nourished, NAD  HEENT: OP clear, mucus membranes moist  SKIN: warm, dry. No rashes. Neuro: No focal deficits  Musculoskeletal: Muscle strength 5/5 all ext  Psychiatric: Mood and affect normal  Neck: No JVD, no carotid bruits, no thyromegaly, no lymphadenopathy.  Lungs:Clear bilaterally, no wheezes, rhonci, crackles Cardiovascular: Regular rate and rhythm. No murmurs, gallops or rubs. Abdomen:Soft. Bowel sounds present. Non-tender.  Extremities:  No lower extremity edema. Pulses are 2 + in the bilateral DP/PT.  Cardiac cath November 2014: Left main: No obstructive disease.  Left Anterior Descending Artery: Large caliber vessel that courses to the apex. 50% mid stenosis. 2 small caliber diagonal branches.  Circumflex Artery: Large caliber vessel with termination into a large obtuse marginal branch. Mild plaque disease in the obtuse marginal branch.  Right Coronary Artery: Large dominant vessel with long 99% stenosis of the mid vessel followed by 99% stenosis in the distal vessel. The posterolateral artery  and the PDA is small to moderate in caliber with mild diffuse plaque disease. The distal vessel is seen to fill also from left to right collaterals  Echo September 2017: Left ventricle: The cavity  size was normal. Wall thickness was normal. Systolic function was   normal. The estimated ejection fraction was in the range of 55%   to 60%. Doppler parameters are consistent with abnormal left   ventricular relaxation (grade 1 diastolic dysfunction). - Aortic valve: AV prosthesis appears to open well Peak and mean   gradients through the valve are 31 and 19 mm Hg respectively. - Mitral valve: There was mild regurgitation. - Pericardium, extracardiac: A trivial pericardial effusion was   identified.  EKG:  EKG is not ordered today. The ekg ordered today demonstrates   Recent Labs: 11/13/2017: ALT 21; BUN 23; Creatinine, Ser 0.66; Hemoglobin 12.1; Platelets 333; Potassium 3.8; Sodium 140; TSH 0.872   Lipid Panel    Component Value Date/Time   CHOL 184 11/13/2017 1010   TRIG 79 11/13/2017 1010   HDL 64 11/13/2017 1010   CHOLHDL 2.9 11/13/2017 1010   CHOLHDL 3 12/05/2013 0940   VLDL 17.6 12/05/2013 0940   LDLCALC 104 (H) 11/13/2017 1010     Wt Readings from Last 3 Encounters:  06/28/18 166 lb 12.8 oz (75.7 kg)  11/13/17 169 lb 6.4 oz (76.8 kg)  03/05/17 168 lb 12.8 oz (76.6 kg)     Other studies Reviewed: Additional  studies/ records that were reviewed today include: . Review of the above records demonstrates:    Assessment and Plan:   1. CAD s/p CABG without angina: She has no chest pain. Will continue ASA and statin.   2. Aortic stenosis s/p AVR: Asymptomatic. Exam is normal. SBP prophylaxis discussed.   3. HTN: BP is controlled. No changes  4. Hyperlipidemia: Continue statin. Will arrange repeat lipids and LFTs in February 2019  Current medicines are reviewed at length with the patient today.  The patient does not have concerns regarding medicines.  The following changes have been made:  no change  Labs/ tests ordered today include:   Orders Placed This Encounter  Procedures  . Comp Met (CMET)  . Lipid Profile     Disposition:   FU with me in 12 months   Signed, Lauree Chandler, MD 06/28/2018 12:08 PM    [address], [place], [place]  [address] Phone: [phone]; Fax: [phone]

## 2018-06-28 NOTE — Patient Instructions (Signed)
Medication Instructions:  Your physician recommends that you continue on your current medications as directed. Please refer to the Current Medication list given to you today.   Labwork: Your physician recommends that you return for lab work in: February 2020.  --CMET and Lipid profile.  This will be fasting.    Testing/Procedures: none  Follow-Up: Your physician wants you to follow-up in: 12 months.  You will receive a reminder letter in the mail two months in advance. If you don't receive a letter, please call our office to schedule the follow-up appointment.   Any Other Special Instructions Will Be Listed Below (If Applicable).  Your physician discussed the importance of taking an antibiotic prior to any dental, gastrointestinal, genitourinary procedures to prevent damage to the heart valves from infection. A prescription was sent to your pharmacy for an antibiotic based on current SBE prophylaxis guidelines.      If you need a refill on your cardiac medications before your next appointment, please call your pharmacy.

## 2018-11-26 ENCOUNTER — Other Ambulatory Visit: Payer: Self-pay

## 2019-07-27 ENCOUNTER — Other Ambulatory Visit: Payer: Self-pay | Admitting: Cardiovascular Disease

## 2019-07-27 MED ORDER — TRIAMTERENE-HCTZ 37.5-25 MG PO CAPS: ORAL_CAPSULE | ORAL | 0 refills | Status: AC

## 2019-07-27 MED ORDER — LOVASTATIN 20 MG PO TABS: 20.0000 mg | ORAL_TABLET | Freq: Every day | ORAL | 0 refills | Status: AC

## 2022-11-17 ENCOUNTER — Ambulatory Visit: Payer: Self-pay | Admitting: Cardiovascular Disease
